# Patient Record
Sex: Male | Born: 1995 | Race: White | Hispanic: No | Marital: Single | Smoking: Never smoker
Health system: Southern US, Community
[De-identification: ages and names within clinical notes are randomized; demographics above are authoritative.]

## PROBLEM LIST (undated history)

## (undated) DIAGNOSIS — G4700 Insomnia, unspecified: Secondary | ICD-10-CM

## (undated) DIAGNOSIS — T7840XA Allergy, unspecified, initial encounter: Secondary | ICD-10-CM

## (undated) DIAGNOSIS — F909 Attention-deficit hyperactivity disorder, unspecified type: Secondary | ICD-10-CM

## (undated) DIAGNOSIS — Z00129 Encounter for routine child health examination without abnormal findings: Secondary | ICD-10-CM

## (undated) DIAGNOSIS — B079 Viral wart, unspecified: Secondary | ICD-10-CM

## (undated) DIAGNOSIS — M8580 Other specified disorders of bone density and structure, unspecified site: Secondary | ICD-10-CM

## (undated) HISTORY — DX: Attention-deficit hyperactivity disorder, unspecified type: F90.9

## (undated) HISTORY — PX: WISDOM TOOTH EXTRACTION: SHX21

## (undated) HISTORY — DX: Viral wart, unspecified: B07.9

## (undated) HISTORY — PX: FRACTURE SURGERY: SHX138

## (undated) HISTORY — DX: Encounter for routine child health examination without abnormal findings: Z00.129

## (undated) HISTORY — DX: Allergy, unspecified, initial encounter: T78.40XA

## (undated) HISTORY — DX: Other specified disorders of bone density and structure, unspecified site: M85.80

## (undated) HISTORY — DX: Insomnia, unspecified: G47.00

---
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
## 2014-06-21 ENCOUNTER — Ambulatory Visit (INDEPENDENT_AMBULATORY_CARE_PROVIDER_SITE_OTHER): Payer: BC Managed Care – PPO | Admitting: Family Medicine

## 2014-06-21 ENCOUNTER — Ambulatory Visit (HOSPITAL_BASED_OUTPATIENT_CLINIC_OR_DEPARTMENT_OTHER)
Admission: RE | Admit: 2014-06-21 | Discharge: 2014-06-21 | Disposition: A | Discharge status: Home / Self Care | Payer: BC Managed Care – PPO | Source: Ambulatory Visit | Attending: Family Medicine | Admitting: Family Medicine

## 2014-06-21 ENCOUNTER — Encounter: Payer: Self-pay | Admitting: Family Medicine

## 2014-06-21 VITALS — BP 130/68 | HR 85 | Temp 98.8°F | Ht 70.5 in | Wt 136.8 lb

## 2014-06-21 DIAGNOSIS — R079 Chest pain, unspecified: Secondary | ICD-10-CM

## 2014-06-21 NOTE — Patient Instructions (Signed)
Encouraged increased rest and hydration, add probiotics, zinc such as Coldeze or Xicam. Treat fevers as needed. Plain mucinex twice a day x 1 week, probiotics include Digestive Advantage, Phillip's COlon health or a generic  Try Salon Pas gel or patches for the discomfort  Costochondritis Costochondritis, sometimes called Tietze syndrome, is a swelling and irritation (inflammation) of the tissue (cartilage) that connects your ribs with your breastbone (sternum). It causes pain in the chest and rib area. Costochondritis usually goes away on its own over time. It can take up to 6 weeks or longer to get better, especially if you are unable to limit your activities. CAUSES  Some cases of costochondritis have no known cause. Possible causes include:  Injury (trauma).  Exercise or activity such as lifting.  Severe coughing. SIGNS AND SYMPTOMS  Pain and tenderness in the chest and rib area.  Pain that gets worse when coughing or taking deep breaths.  Pain that gets worse with specific movements. DIAGNOSIS  Your health care provider will do a physical exam and ask about your symptoms. Chest X-rays or other tests may be done to rule out other problems. TREATMENT  Costochondritis usually goes away on its own over time. Your health care provider may prescribe medicine to help relieve pain. HOME CARE INSTRUCTIONS   Avoid exhausting physical activity. Try not to strain your ribs during normal activity. This would include any activities using chest, abdominal, and side muscles, especially if heavy weights are used.  Apply ice to the affected area for the first 2 days after the pain begins.  Put ice in a plastic bag.  Place a towel between your skin and the bag.  Leave the ice on for 20 minutes, 2-3 times a day.  Only take over-the-counter or prescription medicines as directed by your health care provider. SEEK MEDICAL CARE IF:  You have redness or swelling at the rib joints. These are signs  of infection.  Your pain does not go away despite rest or medicine. SEEK IMMEDIATE MEDICAL CARE IF:   Your pain increases or you are very uncomfortable.  You have shortness of breath or difficulty breathing.  You cough up blood.  You have worse chest pains, sweating, or vomiting.  You have a fever or persistent symptoms for more than 2-3 days.  You have a fever and your symptoms suddenly get worse. MAKE SURE YOU:   Understand these instructions.  Will watch your condition.  Will get help right away if you are not doing well or get worse. Document Released: 07/15/2005 Document Revised: 07/26/2013 Document Reviewed: 05/09/2013 [facility] Patient Information 2015 [place], [place]. This information is not intended to replace advice given to you by your health care provider. Make sure you discuss any questions you have with your health care provider.

## 2014-06-25 ENCOUNTER — Encounter: Payer: Self-pay | Admitting: Family Medicine

## 2014-06-25 DIAGNOSIS — R079 Chest pain, unspecified: Secondary | ICD-10-CM | POA: Insufficient documentation

## 2014-06-25 NOTE — Progress Notes (Signed)
Patient ID: Brandon Pitts, male   DOB: Mar 10, 1996, 18 y.o.   MRN: [id number] Raef Sprigg [id number] 1996-09-05 06/25/2014      Progress Note-Follow Up  Subjective  Chief Complaint  Chief Complaint  Patient presents with  . ribs hurting    both [place] X have had pain for awhile last Couple of months can't lay on stomach or Hover  . warts    on both hands    HPI  Patient is a 18 year old male in today for routine medical care. In today complaining of pain on his lower anterior ribs bilaterally only when he lies on his stomach. No trouble he lies on his back or side. No recent trauma. No shortness of breath or congestion. No other acute complaints such as illness or fever. Denies palp/SOB/HA/congestion/fevers/GI or GU c/o. Taking meds as prescribed  Past Medical History  Diagnosis Date  . ADHD (attention deficit hyperactivity disorder)   . WCC (well child check) 06/20/2012  . Low bone density for age 05/20/2012  . Insomnia 08/03/2012    Past Surgical History  Procedure Laterality Date  . Wisdom tooth extraction    . Fracture surgery      pin in left elbow, removed    Family History  Problem Relation Age of Onset  . Hypothyroidism Mother   . Cancer Father     testicular, mid 58s  . Hyperlipidemia Father   . Hypertension Father   . ADD / ADHD Sister   . Cancer Maternal Grandfather     breast  . Hypertension Maternal Grandfather   . Hyperlipidemia Maternal Grandfather   . Seizures Paternal Grandmother   . Hypertension Paternal Grandmother   . Hypertension Paternal Grandfather     uncontrolled  . Cancer Paternal Grandfather     precancer of colon s/p partial colectomy    History   Social History  . Marital Status: Single    Spouse Name: N/A    Number of Children: N/A  . Years of Education: N/A   Occupational History  . Not on file.   Social History Main Topics  . Smoking status: Never Smoker   . Smokeless tobacco: Never Used  . Alcohol Use: No  . Drug Use: No  .  Sexual Activity: Yes    Partners: Female   Other Topics Concern  . Not on file   Social History Narrative  . No narrative on file    No current outpatient prescriptions on file prior to visit.   No current facility-administered medications on file prior to visit.    Allergies  Allergen Reactions  . Penicillins     rash    Review of Systems  Review of Systems  Constitutional: Negative for fever and malaise/fatigue.  HENT: Negative for congestion.   Eyes: Negative for discharge.  Respiratory: Negative for shortness of breath.   Cardiovascular: Positive for chest pain. Negative for palpitations and leg swelling.  Gastrointestinal: Negative for nausea, abdominal pain and diarrhea.  Genitourinary: Negative for dysuria.  Musculoskeletal: Negative for falls.  Skin: Negative for rash.  Neurological: Negative for loss of consciousness and headaches.  Endo/Heme/Allergies: Negative for polydipsia.  Psychiatric/Behavioral: Negative for depression and suicidal ideas. The patient is not nervous/anxious and does not have insomnia.     Objective  BP 130/68  Pulse 85  Temp(Src) 98.8 F (37.1 C) (Oral)  Ht 5' 10.5" (1.791 m)  Wt 136 lb 12.8 oz (62.052 kg)  BMI 19.34 kg/m2  SpO2 98%  Physical Exam  Physical Exam  Constitutional: He is oriented to person, place, and time and well-developed, well-nourished, and in no distress. No distress.  HENT:  Head: Normocephalic and atraumatic.  Eyes: Conjunctivae are normal.  Neck: Neck supple. No thyromegaly present.  Cardiovascular: Normal rate, regular rhythm and normal heart sounds.   No murmur heard. Pulmonary/Chest: Effort normal and breath sounds normal. No respiratory distress.  Abdominal: He exhibits no distension and no mass. There is no tenderness.  Musculoskeletal: He exhibits no edema.  Neurological: He is alert and oriented to person, place, and time.  Skin: Skin is warm.  Psychiatric: Memory, affect and judgment normal.       Assessment & Plan  Pain in the chest Only anterior lower ribs b/l when he lies on them. Likely related to body habitus and growth spurt, may try topical treatments and report worsening symptolms. CXR is unremarkable today

## 2014-06-25 NOTE — Assessment & Plan Note (Signed)
Only anterior lower ribs b/l when he lies on them. Likely related to body habitus and growth spurt, may try topical treatments and report worsening symptolms. CXR is unremarkable today

## 2014-07-03 ENCOUNTER — Ambulatory Visit (INDEPENDENT_AMBULATORY_CARE_PROVIDER_SITE_OTHER): Payer: BC Managed Care – PPO | Admitting: Physician Assistant

## 2014-07-03 ENCOUNTER — Encounter: Payer: Self-pay | Admitting: Physician Assistant

## 2014-07-03 VITALS — BP 114/76 | HR 80 | Temp 98.4°F | Resp 16 | Ht 69.5 in | Wt 138.2 lb

## 2014-07-03 DIAGNOSIS — J209 Acute bronchitis, unspecified: Secondary | ICD-10-CM

## 2014-07-03 MED ORDER — AZITHROMYCIN 250 MG PO TABS
ORAL_TABLET | ORAL | Status: DC
Start: 2014-07-03 — End: 2016-03-17

## 2014-07-03 NOTE — Patient Instructions (Signed)
Please take Azithromycin as directed.  Increase fluid intake.  Rest.  Use Delsym for cough.  Call or return to clinic if symptoms are not improving.

## 2014-07-03 NOTE — Progress Notes (Signed)
Patient presents to clinic today c/o chest congestion, productive cough and fatigue x 2.5 weeks.  Also endorses mild sore throat that is improving.  Denies SOB, pleuritic chest pain or wheezing.  Past Medical History  Diagnosis Date  . ADHD (attention deficit hyperactivity disorder)   . WCC (well child check) 06/20/2012  . Low bone density for age 19/11/2011  . Insomnia 08/03/2012    No current outpatient prescriptions on file prior to visit.   No current facility-administered medications on file prior to visit.    Allergies  Allergen Reactions  . Penicillins     rash    Family History  Problem Relation Age of Onset  . Hypothyroidism Mother   . Cancer Father     testicular, mid 16s  . Hyperlipidemia Father   . Hypertension Father   . ADD / ADHD Sister   . Cancer Maternal Grandfather     breast  . Hypertension Maternal Grandfather   . Hyperlipidemia Maternal Grandfather   . Seizures Paternal Grandmother   . Hypertension Paternal Grandmother   . Hypertension Paternal Grandfather     uncontrolled  . Cancer Paternal Grandfather     precancer of colon s/p partial colectomy    History   Social History  . Marital Status: Single    Spouse Name: N/A    Number of Children: N/A  . Years of Education: N/A   Social History Main Topics  . Smoking status: Never Smoker   . Smokeless tobacco: Never Used  . Alcohol Use: No  . Drug Use: No  . Sexual Activity: Yes    Partners: Female   Other Topics Concern  . None   Social History Narrative  . None   Review of Systems - See HPI.  All other ROS are negative.  BP 114/76  Pulse 80  Temp(Src) 98.4 F (36.9 C) (Oral)  Resp 16  Ht 5' 9.5" (1.765 m)  Wt 138 lb 4 oz (62.71 kg)  BMI 20.13 kg/m2  SpO2 97%  Physical Exam  Vitals reviewed. Constitutional: He is oriented to person, place, and time and well-developed, well-nourished, and in no distress.  HENT:  Head: Normocephalic and atraumatic.  Right Ear: External ear  normal.  Left Ear: External ear normal.  Nose: Nose normal.  Mouth/Throat: Oropharynx is clear and moist. No oropharyngeal exudate.  TM within normal limits bilaterally.  Eyes: Conjunctivae are normal. Pupils are equal, round, and reactive to light.  Neck: Neck supple.  Cardiovascular: Normal rate, regular rhythm, normal heart sounds and intact distal pulses.   Pulmonary/Chest: Effort normal and breath sounds normal. No respiratory distress. He has no wheezes. He has no rales. He exhibits no tenderness.  Neurological: He is alert and oriented to person, place, and time.  Skin: Skin is warm and dry. No rash noted.  Psychiatric: Affect normal.   Assessment/Plan: Acute bronchitis Rx Azithromycin.  Increase fluids.  Rest.  Saline nasal spray. Probiotic. Delsym for cough.  Return precautions discussed with patient.

## 2014-07-03 NOTE — Progress Notes (Signed)
Pre visit review using our clinic review tool, if applicable. No additional management support is needed unless otherwise documented below in the visit note/SLS  

## 2014-07-03 NOTE — Assessment & Plan Note (Signed)
Rx Azithromycin.  Increase fluids.  Rest.  Saline nasal spray. Probiotic. Delsym for cough.  Return precautions discussed with patient.

## 2015-01-07 ENCOUNTER — Encounter: Payer: BLUE CROSS/BLUE SHIELD | Admitting: Family Medicine

## 2015-01-16 ENCOUNTER — Telehealth: Payer: Self-pay | Admitting: Family Medicine

## 2015-01-16 ENCOUNTER — Encounter: Payer: Self-pay | Admitting: Family Medicine

## 2015-01-16 NOTE — Telephone Encounter (Signed)
Pt was no show for CPE on 01/07/15- letter sent- would you like us to attempt to reschedule? Charge?

## 2015-01-16 NOTE — Telephone Encounter (Signed)
He has not been in for a check up in awhile. We should try and reschedule. He is a [employer] student so am willing to forgo the fee.

## 2015-07-29 ENCOUNTER — Ambulatory Visit (INDEPENDENT_AMBULATORY_CARE_PROVIDER_SITE_OTHER): Payer: BLUE CROSS/BLUE SHIELD | Admitting: Physician Assistant

## 2015-07-29 VITALS — BP 130/60 | HR 87 | Temp 98.9°F | Resp 16 | Ht 70.0 in | Wt 142.4 lb

## 2015-07-29 DIAGNOSIS — J069 Acute upper respiratory infection, unspecified: Secondary | ICD-10-CM

## 2015-07-29 DIAGNOSIS — B9789 Other viral agents as the cause of diseases classified elsewhere: Principal | ICD-10-CM

## 2015-07-29 MED ORDER — HYDROCODONE-HOMATROPINE 5-1.5 MG/5ML PO SYRP: 5.0000 mL | ORAL_SOLUTION | Freq: Three times a day (TID) | ORAL | Status: DC | PRN

## 2015-07-29 MED ORDER — LORATADINE-PSEUDOEPHEDRINE ER 10-240 MG PO TB24: 1.0000 | ORAL_TABLET | Freq: Every day | ORAL | Status: DC

## 2015-07-29 MED ORDER — NAPROXEN 500 MG PO TABS: 500.0000 mg | ORAL_TABLET | Freq: Two times a day (BID) | ORAL | Status: DC

## 2015-07-29 NOTE — Progress Notes (Signed)
07/29/2015 at 9:05 PM  Brandon Pitts / DOB: June 02, 1996 / MRN: [id number]  The patient has Goiter, unspecified; Unspecified vitamin D deficiency; ADHD (attention deficit hyperactivity disorder); WCC (well child check); Low bone density for age; Insomnia; Pain in the chest; and Acute bronchitis on his problem list.  SUBJECTIVE  Brandon Pitts is a 19 y.o. male complaining of sinus and nasal congestion and sore throat that started 1 days ago.  Associated symptoms include sore throat and headache today, and he denies fever, difficulty breathing and jaw pain.The patient symptoms are worsening. Treatments tried thus far include nothing. He reports sick contacts.    He  has a past medical history of ADHD (attention deficit hyperactivity disorder); WCC (well child check) (06/20/2012); Low bone density for age (06/20/2012); Insomnia (08/03/2012); and Allergy.    Medications reviewed and updated by myself where necessary, and exist elsewhere in the encounter.   Brandon Pitts is allergic to penicillins. He  reports that he has never smoked. He has never used smokeless tobacco. He reports that he does not drink alcohol or use illicit drugs. He  reports that he currently engages in sexual activity and has had male partners. The patient  has past surgical history that includes Wisdom tooth extraction and Fracture surgery.  His family history includes ADD / ADHD in his sister; Cancer in his father, maternal grandfather, and paternal grandfather; Hyperlipidemia in his father and maternal grandfather; Hypertension in his father, maternal grandfather, paternal grandfather, and paternal grandmother; Hypothyroidism in his mother; Seizures in his paternal grandmother.  Review of Systems  Constitutional: Negative for fever and chills.  Respiratory: Negative for shortness of breath.   Cardiovascular: Negative for chest pain.  Gastrointestinal: Negative for nausea and abdominal pain.  Genitourinary: Negative.   Skin:  Negative for rash.  Neurological: Negative for dizziness and headaches.    OBJECTIVE  His  height is  (1.778 m) and weight is 142 lb 6 oz (64.581 kg). His temperature is 98.9 F (37.2 C). His blood pressure is 130/60 and his pulse is 87. His respiration is 16 and oxygen saturation is 98%.  The patient's body mass index is 20.43 kg/(m^2).  Physical Exam  Constitutional: He is oriented to person, place, and time. He appears well-developed and well-nourished. No distress.  HENT:  Right Ear: Hearing, tympanic membrane, external ear and ear canal normal.  Left Ear: Hearing, tympanic membrane, external ear and ear canal normal.  Nose: Mucosal edema present. No sinus tenderness. Right sinus exhibits no maxillary sinus tenderness and no frontal sinus tenderness. Left sinus exhibits no maxillary sinus tenderness and no frontal sinus tenderness.  Mouth/Throat: Uvula is midline and mucous membranes are normal. Mucous membranes are not pale, not dry and not cyanotic. Posterior oropharyngeal erythema present. No oropharyngeal exudate, posterior oropharyngeal edema or tonsillar abscesses.  Cardiovascular: Normal rate and regular rhythm.   Respiratory: Effort normal and breath sounds normal. He has no wheezes. He has no rales.  Musculoskeletal: Normal range of motion.  Lymphadenopathy:    He has no cervical adenopathy.  Neurological: He is alert and oriented to person, place, and time.  Skin: Skin is warm and dry. He is not diaphoretic.  Psychiatric: He has a normal mood and affect.    No results found for this or any previous visit (from the past 24 hour(s)).  ASSESSMENT & PLAN  Loc was seen today for generalized body aches, headache, cough and sore throat.  Diagnoses and all orders for this visit:  Viral  URI with cough: Exam reassuring.  Will treat symptomatically for now.  Advised he RTC or call if his symptoms change.  -     naproxen (NAPROSYN) 500 MG tablet; Take 1 tablet (500 mg  total) by mouth 2 (two) times daily with a meal. -     HYDROcodone-homatropine (HYCODAN) 5-1.5 MG/5ML syrup; Take 5 mLs by mouth every 8 (eight) hours as needed for cough. -     loratadine-pseudoephedrine (CLARITIN-D 24 HOUR) 10-240 MG 24 hr tablet; Take 1 tablet by mouth daily.    The patient was advised to call or come back to clinic if he does not see an improvement in symptoms, or worsens with the above plan.   Deliah Boston, MHS, PA-C [facility] 07/29/2015 9:05 PM

## 2016-03-17 ENCOUNTER — Other Ambulatory Visit: Payer: Self-pay | Admitting: Family Medicine

## 2016-03-17 ENCOUNTER — Ambulatory Visit (INDEPENDENT_AMBULATORY_CARE_PROVIDER_SITE_OTHER): Payer: BLUE CROSS/BLUE SHIELD | Admitting: Family Medicine

## 2016-03-17 ENCOUNTER — Encounter: Payer: Self-pay | Admitting: Family Medicine

## 2016-03-17 VITALS — BP 116/60 | HR 96 | Temp 99.7°F | Ht 70.0 in | Wt 146.0 lb

## 2016-03-17 DIAGNOSIS — W57XXXA Bitten or stung by nonvenomous insect and other nonvenomous arthropods, initial encounter: Secondary | ICD-10-CM

## 2016-03-17 DIAGNOSIS — T148 Other injury of unspecified body region: Secondary | ICD-10-CM

## 2016-03-17 DIAGNOSIS — Z8719 Personal history of other diseases of the digestive system: Secondary | ICD-10-CM

## 2016-03-17 MED ORDER — NYSTATIN 100000 UNIT/ML MT SUSP: 5.0000 mL | Freq: Four times a day (QID) | OROMUCOSAL | Status: DC

## 2016-03-17 MED ORDER — DOXYCYCLINE HYCLATE 100 MG PO TABS
100.0000 mg | ORAL_TABLET | Freq: Two times a day (BID) | ORAL | Status: DC
Start: 2016-03-17 — End: 2016-03-23

## 2016-03-17 NOTE — Patient Instructions (Signed)
Tick Bite Information Ticks are insects that attach themselves to the skin and draw blood for food. There are various types of ticks. Common types include wood ticks and deer ticks. Most ticks live in shrubs and grassy areas. Ticks can climb onto your body when you make contact with leaves or grass where the tick is waiting. The most common places on the body for ticks to attach themselves are the scalp, neck, armpits, waist, and groin. Most tick bites are harmless, but sometimes ticks carry germs that cause diseases. These germs can be spread to a person during the tick's feeding process. The chance of a disease spreading through a tick bite depends on:   The type of tick.  Time of year.   How long the tick is attached.   Geographic location.  HOW CAN YOU PREVENT TICK BITES? Take these steps to help prevent tick bites when you are outdoors:  Wear protective clothing. Long sleeves and long pants are best.   Wear white clothes so you can see ticks more easily.  Tuck your pant legs into your socks.   If walking on a trail, stay in the middle of the trail to avoid brushing against bushes.  Avoid walking through areas with long grass.  Put insect repellent on all exposed skin and along boot tops, pant legs, and sleeve cuffs.   Check clothing, hair, and skin repeatedly and before going inside.   Brush off any ticks that are not attached.  Take a shower or bath as soon as possible after being outdoors.  WHAT IS THE PROPER WAY TO REMOVE A TICK? Ticks should be removed as soon as possible to help prevent diseases caused by tick bites. 1. If latex gloves are available, put them on before trying to remove a tick.  2. Using fine-point tweezers, grasp the tick as close to the skin as possible. You may also use curved forceps or a tick removal tool. Grasp the tick as close to its head as possible. Avoid grasping the tick on its body. 3. Pull gently with steady upward pressure until  the tick lets go. Do not twist the tick or jerk it suddenly. This may break off the tick's head or mouth parts. 4. Do not squeeze or crush the tick's body. This could force disease-carrying fluids from the tick into your body.  5. After the tick is removed, wash the bite area and your hands with soap and water or other disinfectant such as alcohol. 6. Apply a small amount of antiseptic cream or ointment to the bite site.  7. Wash and disinfect any instruments that were used.  Do not try to remove a tick by applying a hot match, petroleum jelly, or fingernail polish to the tick. These methods do not work and may increase the chances of disease being spread from the tick bite.  WHEN SHOULD YOU SEEK MEDICAL CARE? Contact your health care provider if you are unable to remove a tick from your skin or if a part of the tick breaks off and is stuck in the skin.  After a tick bite, you need to be aware of signs and symptoms that could be related to diseases spread by ticks. Contact your health care provider if you develop any of the following in the days or weeks after the tick bite:  Unexplained fever.  Rash. A circular rash that appears days or weeks after the tick bite may indicate the possibility of Lyme disease. The rash may resemble   a target with a bull's-eye and may occur at a different part of your body than the tick bite.  Redness and swelling in the area of the tick bite.   Tender, swollen lymph glands.   Diarrhea.   Weight loss.   Cough.   Fatigue.   Muscle, joint, or bone pain.   Abdominal pain.   Headache.   Lethargy or a change in your level of consciousness.  Difficulty walking or moving your legs.   Numbness in the legs.   Paralysis.  Shortness of breath.   Confusion.   Repeated vomiting.    This information is not intended to replace advice given to you by your health care provider. Make sure you discuss any questions you have with your health  care provider.   Document Released: 10/02/2000 Document Revised: 10/26/2014 Document Reviewed: 03/15/2013 Elsevier Interactive Patient Education 2016 Elsevier Inc.  

## 2016-03-17 NOTE — Progress Notes (Signed)
Pre visit review using our clinic review tool, if applicable. No additional management support is needed unless otherwise documented below in the visit note. 

## 2016-03-17 NOTE — Progress Notes (Signed)
Patient ID: Brandon Pitts, male    DOB: 11/08/1995  Age: 20 y.o. MRN: [id number]    Subjective:  Subjective HPI Brandon Pitts presents for fever 101, bodyaches x 2 days.  He has found mult ticks on him over last 3-4 weeks.   No congestion,  No cough.    Review of Systems  Constitutional: Positive for fever, chills and fatigue. Negative for diaphoresis, appetite change and unexpected weight change.  HENT: Negative for congestion, ear pain, hearing loss, nosebleeds, postnasal drip, rhinorrhea, sinus pressure, sneezing and tinnitus.   Eyes: Negative for photophobia, pain, discharge, redness, itching and visual disturbance.  Respiratory: Negative.  Negative for cough, chest tightness, shortness of breath and wheezing.   Cardiovascular: Negative.  Negative for chest pain, palpitations and leg swelling.  Gastrointestinal: Negative for abdominal pain, constipation, blood in stool, abdominal distention and anal bleeding.  Endocrine: Negative.  Negative for cold intolerance, heat intolerance, polydipsia, polyphagia and polyuria.  Genitourinary: Negative.  Negative for dysuria, frequency and difficulty urinating.  Musculoskeletal: Negative.   Skin: Negative.   Allergic/Immunologic: Negative.   Neurological: Negative for dizziness, weakness, light-headedness, numbness and headaches.  Psychiatric/Behavioral: Negative for suicidal ideas, confusion, sleep disturbance, dysphoric mood, decreased concentration and agitation. The patient is not nervous/anxious.     History Past Medical History  Diagnosis Date  . ADHD (attention deficit hyperactivity disorder)   . WCC (well child check) 06/20/2012  . Low bone density for age 72/11/2011  . Insomnia 08/03/2012  . Allergy     He has past surgical history that includes Wisdom tooth extraction and Fracture surgery.   His family history includes ADD / ADHD in his sister; Cancer in his father, maternal grandfather, and paternal grandfather; Hyperlipidemia in his  father and maternal grandfather; Hypertension in his father, maternal grandfather, paternal grandfather, and paternal grandmother; Hypothyroidism in his mother; Seizures in his paternal grandmother.He reports that he has never smoked. He has never used smokeless tobacco. He reports that he does not drink alcohol or use illicit drugs.  No current outpatient prescriptions on file prior to visit.   No current facility-administered medications on file prior to visit.     Objective:  Objective Physical Exam  Constitutional: He is oriented to person, place, and time. Vital signs are normal. He appears well-developed and well-nourished. He is sleeping. He has a sickly appearance.  HENT:  Head: Normocephalic and atraumatic.  Mouth/Throat: Oropharynx is clear and moist.  Eyes: EOM are normal. Pupils are equal, round, and reactive to light.  Neck: Normal range of motion and full passive range of motion without pain. Neck supple. No thyromegaly present.  Cardiovascular: Normal rate and regular rhythm.   No murmur heard. Pulmonary/Chest: Effort normal and breath sounds normal. No respiratory distress. He has no wheezes. He has no rales. He exhibits no tenderness.  Musculoskeletal: He exhibits no edema or tenderness.  Neurological: He is alert and oriented to person, place, and time.  Skin: Skin is warm and dry. No rash noted. No erythema.  No obvious bites today Pt is flushed--- warm to touch  Psychiatric: He has a normal mood and affect. His behavior is normal. Judgment and thought content normal.  Nursing note and vitals reviewed.  BP 116/60 mmHg  Pulse 96  Temp(Src) 99.7 F (37.6 C) (Oral)  Ht 5\' 10"  (1.778 m)  Wt 146 lb (66.225 kg)  BMI 20.95 kg/m2  SpO2 99% Wt Readings from Last 3 Encounters:  03/17/16 146 lb (66.225 kg)  07/29/15 142 lb 6  oz (64.581 kg) (29 %*, Z = -0.56)  07/03/14 138 lb 4 oz (62.71 kg) (27 %*, Z = -0.60)   * Growth percentiles are based on CDC 2-20 Years data.      No results found for: WBC, HGB, HCT, PLT, GLUCOSE, CHOL, TRIG, HDL, LDLDIRECT, LDLCALC, ALT, AST, NA, K, CL, CREATININE, BUN, CO2, TSH, PSA, INR, GLUF, HGBA1C, MICROALBUR  Dg Chest 2 View  06/21/2014  CLINICAL DATA:  Chest pain EXAM: CHEST  2 VIEW COMPARISON:  None. FINDINGS: The heart size and mediastinal contours are within normal limits. Both lungs are clear. The visualized skeletal structures are unremarkable. IMPRESSION: No active cardiopulmonary disease. Electronically Signed   By: Natasha Mead M.D.   On: 06/21/2014 16:20     Assessment & Plan:  Plan I have discontinued [name] azithromycin, naproxen, HYDROcodone-homatropine, and loratadine-pseudoephedrine. I am also having him start on doxycycline and nystatin.  Meds ordered this encounter  Medications  . doxycycline (VIBRA-TABS) 100 MG tablet    Sig: Take 1 tablet (100 mg total) by mouth 2 (two) times daily.    Dispense:  20 tablet    Refill:  0  . nystatin (MYCOSTATIN) 100000 UNIT/ML suspension    Sig: Take 5 mLs (500,000 Units total) by mouth 4 (four) times daily.    Dispense:  60 mL    Refill:  0    Problem List Items Addressed This Visit    None    Visit Diagnoses    Tick bite of multiple sites    -  Primary    Relevant Medications    doxycycline (VIBRA-TABS) 100 MG tablet    Other Relevant Orders    CBC with Differential/Platelet    B. burgdorfi antibodies    Rocky mtn spotted fvr ab, IgM-blood    H/O oral aphthous ulcers        Relevant Medications    nystatin (MYCOSTATIN) 100000 UNIT/ML suspension       Follow-up: Return if symptoms worsen or fail to improve.  Donato Schultz, DO

## 2016-03-18 ENCOUNTER — Telehealth: Payer: Self-pay | Admitting: *Deleted

## 2016-03-18 DIAGNOSIS — R899 Unspecified abnormal finding in specimens from other organs, systems and tissues: Secondary | ICD-10-CM

## 2016-03-18 DIAGNOSIS — D729 Disorder of white blood cells, unspecified: Secondary | ICD-10-CM

## 2016-03-18 LAB — CBC WITH DIFFERENTIAL/PLATELET
BASOS ABS: 0 10*3/uL (ref 0.0–0.1)
Basophils Relative: 0.4 % (ref 0.0–3.0)
EOS ABS: 0 10*3/uL (ref 0.0–0.7)
Eosinophils Relative: 0.1 % (ref 0.0–5.0)
HEMATOCRIT: 44.8 % (ref 39.0–52.0)
HEMOGLOBIN: 15.2 g/dL (ref 13.0–17.0)
LYMPHS PCT: 8.7 % — AB (ref 12.0–46.0)
Lymphs Abs: 0.3 10*3/uL — ABNORMAL LOW (ref 0.7–4.0)
MCHC: 33.8 g/dL (ref 30.0–36.0)
MCV: 91.4 fl (ref 78.0–100.0)
Monocytes Absolute: 0.4 10*3/uL (ref 0.1–1.0)
Monocytes Relative: 10.8 % (ref 3.0–12.0)
Neutro Abs: 2.6 10*3/uL (ref 1.4–7.7)
Neutrophils Relative %: 80 % — ABNORMAL HIGH (ref 43.0–77.0)
Platelets: 124 10*3/uL — ABNORMAL LOW (ref 150.0–400.0)
RBC: 4.9 Mil/uL (ref 4.22–5.81)
RDW: 13.3 % (ref 11.5–14.6)
WBC: 3.3 10*3/uL — AB (ref 4.5–10.5)

## 2016-03-18 NOTE — Telephone Encounter (Signed)
Spoke with the pt and informed him of recent lab results and note.  Pt verbalized understanding and agreed.  Pt stated that he is feeling a lot better today.  Pt will call back to schedule lab appt.  Future lab ordered and sent.//AB/CMA

## 2016-03-18 NOTE — Telephone Encounter (Signed)
-----   Message from Donato SchultzYvonne R Lowne Chase, [place]DO sent at 03/18/2016  4:05 PM EDT ----- How is pt feeling?  Wbc and diff abn------  Repeat after abx

## 2016-03-19 LAB — LYME AB/WESTERN BLOT REFLEX: B burgdorferi Ab IgG+IgM: <0.9 Index (ref <0.90)

## 2016-03-19 LAB — ROCKY MTN SPOTTED FVR ABS PNL(IGG+IGM)
RMSF IGG: NOT DETECTED
RMSF IGM: NOT DETECTED

## 2016-03-20 ENCOUNTER — Encounter: Payer: Self-pay | Admitting: Family Medicine

## 2016-03-21 ENCOUNTER — Encounter: Payer: Self-pay | Admitting: Family Medicine

## 2016-03-22 ENCOUNTER — Encounter: Payer: Self-pay | Admitting: Family Medicine

## 2016-03-23 MED ORDER — CLARITHROMYCIN ER 500 MG PO TB24: 1000.0000 mg | ORAL_TABLET | Freq: Every day | ORAL | Status: DC

## 2016-03-23 NOTE — Telephone Encounter (Signed)
-----   Message from Donato SchultzYvonne R Lowne Chase, DO sent at 03/23/2016  8:32 AM EDT ----- See my chart message--- doxy and pen are not related.   What reaction is he having? Can change to biaxin xl 2 po qd x 7 days

## 2016-03-23 NOTE — Telephone Encounter (Signed)
Ok-- document allergy and send biaxin-- thanks

## 2016-03-23 NOTE — Telephone Encounter (Signed)
I have done that, the patient is still complaining of the HA, stiffness in the neck and fatigue.    KP

## 2016-03-23 NOTE — Telephone Encounter (Signed)
The Rx has been sent.    KP

## 2016-05-11 ENCOUNTER — Ambulatory Visit: Payer: BLUE CROSS/BLUE SHIELD | Admitting: Family Medicine

## 2016-05-11 ENCOUNTER — Encounter: Payer: Self-pay | Admitting: Family Medicine

## 2016-05-11 ENCOUNTER — Ambulatory Visit (INDEPENDENT_AMBULATORY_CARE_PROVIDER_SITE_OTHER): Payer: BLUE CROSS/BLUE SHIELD | Admitting: Family Medicine

## 2016-05-11 VITALS — BP 116/62 | HR 64 | Temp 98.2°F | Ht 71.0 in | Wt 140.4 lb

## 2016-05-11 DIAGNOSIS — T148 Other injury of unspecified body region: Secondary | ICD-10-CM

## 2016-05-11 DIAGNOSIS — W57XXXA Bitten or stung by nonvenomous insect and other nonvenomous arthropods, initial encounter: Secondary | ICD-10-CM

## 2016-05-11 MED ORDER — CLARITHROMYCIN ER 500 MG PO TB24: 1000.0000 mg | ORAL_TABLET | Freq: Every day | ORAL | 0 refills | Status: DC

## 2016-05-11 MED ORDER — TRIAMCINOLONE ACETONIDE 0.1 % EX CREA
1.0000 | TOPICAL_CREAM | Freq: Two times a day (BID) | CUTANEOUS | 0 refills | Status: DC
Start: 2016-05-11 — End: 2017-07-01

## 2016-05-11 NOTE — Patient Instructions (Signed)
We will use the biaxin to help prevent lyme disease.  Watch out for any symptoms of fever, chills, flu like feeling or rash- if these occur let me know! Try the triamcinolone as needed for the itching on your skin- apply to affected area (not face) up to twice a day as needed

## 2016-05-11 NOTE — Progress Notes (Signed)
Pre visit review using our clinic review tool, if applicable. No additional management support is needed unless otherwise documented below in the visit note. 

## 2016-05-11 NOTE — Progress Notes (Signed)
[facility] [address] [place], [place] [address] [phone]  Date:  05/11/2016   Name:  Brandon Pitts   DOB:  1996/03/21   MRN:  [id number]  PCP:  Danise Edge, MD    Chief Complaint: No chief complaint on file.   History of Present Illness:  Brandon Pitts is a 20 y.o. very pleasant male patient who presents with the following:  Here today to discuss recent tick bites.  He is generally in good health.  He was outside yesterday checking on some deer cameras- he was not out long, but noted approx 88 "seed ticks" (tick larve) on his skin when he got back in.  They were mostly around his ankles. He used peppermint oil and q tips to remove them  He did get a rash with doxycycline the last time he was treated for tick bites, and is also allergic to amox.  When he had the rash with doxy he was changed to biaxin and tolerated this well He is otherwise generally feeling well- no fever, chills, body aches, unusual rashes, nausea, vomiting, belly pain or cough.   Patient Active Problem List   Diagnosis Date Noted  . Acute bronchitis 07/03/2014  . Pain in the chest 06/25/2014  . Insomnia 08/03/2012  . ADHD (attention deficit hyperactivity disorder) 06/20/2012  . WCC (well child check) 06/20/2012  . Low bone density for age 32/11/2011  . Goiter, unspecified 03/30/2011  . Unspecified vitamin D deficiency 03/30/2011    Past Medical History:  Diagnosis Date  . ADHD (attention deficit hyperactivity disorder)   . Allergy   . Insomnia 08/03/2012  . Low bone density for age 32/11/2011  . WCC (well child check) 06/20/2012    Past Surgical History:  Procedure Laterality Date  . FRACTURE SURGERY     pin in left elbow, removed  . WISDOM TOOTH EXTRACTION      Social History  Substance Use Topics  . Smoking status: Never Smoker  . Smokeless tobacco: Never Used  . Alcohol use No    Family History  Problem Relation Age of  Onset  . Hypothyroidism Mother   . Cancer Father     testicular, mid 36s  . Hyperlipidemia Father   . Hypertension Father   . ADD / ADHD Sister   . Cancer Maternal Grandfather     breast  . Hypertension Maternal Grandfather   . Hyperlipidemia Maternal Grandfather   . Seizures Paternal Grandmother   . Hypertension Paternal Grandmother   . Hypertension Paternal Grandfather     uncontrolled  . Cancer Paternal Grandfather     precancer of colon s/p partial colectomy    Allergies  Allergen Reactions  . Penicillins Rash    rash  . Doxycycline Rash    Medication list has been reviewed and updated.  Current Outpatient Prescriptions on File Prior to Visit  Medication Sig Dispense Refill  . clarithromycin (BIAXIN XL) 500 MG 24 hr tablet Take 2 tablets (1,000 mg total) by mouth daily. 14 tablet 0  . nystatin (MYCOSTATIN) 100000 UNIT/ML suspension Take 5 mLs (500,000 Units total) by mouth 4 (four) times daily. 60 mL 0   No current facility-administered medications on file prior to visit.     Review of Systems:  As per HPI- otherwise negative.   Physical Examination: Vitals:   05/11/16 1639  BP: 116/62  Pulse: 64  Temp: 98.2 F (36.8 C)  Ideal Body Weight:    GEN: WDWN, NAD, Non-toxic, A & O x 3, normal weight, looks well HEENT: Atraumatic, Normocephalic. Neck supple. No masses, No LAD.  Bilateral TM wnl, oropharynx normal.  PEERL,EOMI.   Ears and Nose: No external deformity. CV: RRR, No M/G/R. No JVD. No thrill. No extra heart sounds. PULM: CTA B, no wheezes, crackles, rhonchi. No retractions. No resp. distress. No accessory muscle use. EXTR: No c/c/e NEURO Normal gait.  PSYCH: Normally interactive. Conversant. Not depressed or anxious appearing.  Calm demeanor.  He has mild irritation around his ankles consistent with multiple small insect bites   Assessment and Plan: Tick bite - Plan: triamcinolone cream (KENALOG) 0.1 %, clarithromycin (BIAXIN XL) 500 MG 24  hr tablet  Here today with recent multiple tick bites. He is concerned about illness. Given his volume of tick bites I do think prophylaxis is reasonable.  Will treat with biaxin xl- gave enough for a week but counseled that he can stop after 3-4 days if feeling well Also triamcinolone to use as needed for itching  Meds ordered this encounter  Medications  . triamcinolone cream (KENALOG) 0.1 %    Sig: Apply 1 application topically 2 (two) times daily.    Dispense:  30 g    Refill:  0  . clarithromycin (BIAXIN XL) 500 MG 24 hr tablet    Sig: Take 2 tablets (1,000 mg total) by mouth daily.    Dispense:  14 tablet    Refill:  0     Signed Abbe Amsterdam, MD

## 2016-10-20 DIAGNOSIS — Z23 Encounter for immunization: Secondary | ICD-10-CM | POA: Diagnosis not present

## 2017-02-24 DIAGNOSIS — H6691 Otitis media, unspecified, right ear: Secondary | ICD-10-CM | POA: Diagnosis not present

## 2017-02-24 DIAGNOSIS — J029 Acute pharyngitis, unspecified: Secondary | ICD-10-CM | POA: Diagnosis not present

## 2017-06-29 ENCOUNTER — Ambulatory Visit: Payer: BLUE CROSS/BLUE SHIELD | Admitting: Medical

## 2017-07-01 ENCOUNTER — Encounter: Payer: Self-pay | Admitting: Family Medicine

## 2017-07-01 ENCOUNTER — Ambulatory Visit (INDEPENDENT_AMBULATORY_CARE_PROVIDER_SITE_OTHER): Payer: BLUE CROSS/BLUE SHIELD | Admitting: Family Medicine

## 2017-07-01 DIAGNOSIS — M8580 Other specified disorders of bone density and structure, unspecified site: Secondary | ICD-10-CM | POA: Diagnosis not present

## 2017-07-01 DIAGNOSIS — B079 Viral wart, unspecified: Secondary | ICD-10-CM | POA: Diagnosis not present

## 2017-07-01 DIAGNOSIS — E049 Nontoxic goiter, unspecified: Secondary | ICD-10-CM | POA: Diagnosis not present

## 2017-07-01 DIAGNOSIS — Z8719 Personal history of other diseases of the digestive system: Secondary | ICD-10-CM

## 2017-07-01 DIAGNOSIS — E559 Vitamin D deficiency, unspecified: Secondary | ICD-10-CM

## 2017-07-01 DIAGNOSIS — M859 Disorder of bone density and structure, unspecified: Secondary | ICD-10-CM

## 2017-07-01 HISTORY — DX: Viral wart, unspecified: B07.9

## 2017-07-01 LAB — COMPREHENSIVE METABOLIC PANEL
ALBUMIN: 4.7 g/dL (ref 3.5–5.2)
ALK PHOS: 81 U/L (ref 39–117)
ALT: 12 U/L (ref 0–53)
AST: 14 U/L (ref 0–37)
BUN: 17 mg/dL (ref 6–23)
CO2: 32 mEq/L (ref 19–32)
Calcium: 10.1 mg/dL (ref 8.4–10.5)
Chloride: 104 mEq/L (ref 96–112)
Creatinine, Ser: 1.08 mg/dL (ref 0.40–1.50)
GFR: 91.06 mL/min (ref >60.00)
GLUCOSE: 66 mg/dL — AB (ref 70–99)
Potassium: 4.1 mEq/L (ref 3.5–5.1)
SODIUM: 142 meq/L (ref 135–145)
TOTAL PROTEIN: 6.6 g/dL (ref 6.0–8.3)
Total Bilirubin: 1.2 mg/dL (ref 0.2–1.2)

## 2017-07-01 LAB — TSH: TSH: 0.96 u[IU]/mL (ref 0.35–4.50)

## 2017-07-01 LAB — VITAMIN D 25 HYDROXY (VIT D DEFICIENCY, FRACTURES): VITD: 23.14 ng/mL — AB (ref 30.00–100.00)

## 2017-07-01 MED ORDER — NYSTATIN 100000 UNIT/ML MT SUSP: 5.0000 mL | Freq: Four times a day (QID) | OROMUCOSAL | 2 refills | Status: DC

## 2017-07-01 NOTE — Assessment & Plan Note (Addendum)
Labs reveal deficiency. Start on Vitamin D 50000 IU caps, 1 cap po weekly x 12 weeks. Disp #4 with 4 rf. Also take daily Vitamin D over the counter. If already taking a daily supplement increase by 1000 IU daily and if not start Vitamin D 2000 IU daily.  

## 2017-07-01 NOTE — Assessment & Plan Note (Addendum)
TSH WNL today

## 2017-07-01 NOTE — Patient Instructions (Signed)
Warts Warts are small growths on the skin. They are common and can occur on various areas of the body. A person may have one wart or multiple warts. Most warts are not painful, and they usually do not cause problems. However, warts can cause pain if they are large or occur in an area of the body where pressure will be applied to them, such as the bottom of the foot. In many cases, warts do not require treatment. They usually go away on their own over a period of many months to a couple years. Various treatments may be done for warts that cause problems or do not go away. Sometimes, warts go away and then come back again. What are the causes? Warts are caused by a type of virus that is called human papillomavirus (HPV). This virus can spread from person to person through direct contact. Warts can also spread to other areas of the body when a person scratches a wart and then scratches another area of his or her body. What increases the risk? Warts are more likely to develop in:  People who are 10-20 years of age.  People who have a weakened body defense system (immune system).  What are the signs or symptoms? A wart may be round or oval or have an irregular shape. Most warts have a rough surface. Warts may range in color from skin color to light yellow, brown, or gray. They are generally less than  inch (1.3 cm) in size. Most warts are painless, but some can be painful when pressure is applied to them. How is this diagnosed? A wart can usually be diagnosed from its appearance. In some cases, a tissue sample may be removed (biopsy) to be looked at under a microscope. How is this treated? In many cases, warts do not need treatment. If treatment is needed, options may include:  Applying medicated solutions, creams, or patches to the wart. These may be over-the-counter or prescription medicines that make the skin soft so that layers will gradually shed away. In many cases, the medicine is applied one  or two times per day and covered with a bandage.  Putting duct tape over the top of the wart (occlusion). You will leave the tape in place for as long as told by your health care provider, then you will replace it with a new strip of tape. This is done until the wart goes away.  Freezing the wart with liquid nitrogen (cryotherapy).  Burning the wart with: ? Laser treatment. ? An electrified probe (electrocautery).  Injection of a medicine (Candida antigen) into the wart to help the body's immune system to fight off the wart.  Surgery to remove the wart.  Follow these instructions at home:  Apply over-the-counter and prescription medicines only as told by your health care provider.  Do not apply over-the-counter wart medicines to your face or genitals before you ask your health care provider if it is okay to do so.  Do not scratch or pick at a wart.  Wash your hands after you touch a wart.  Avoid shaving hair that is over a wart.  Keep all follow-up visits as told by your health care provider. This is important. Contact a health care provider if:  Your warts do not improve after treatment.  You have redness, swelling, or pain at the site of a wart.  You have bleeding from a wart that does not stop with light pressure.  You have diabetes and you develop a   wart. This information is not intended to replace advice given to you by your health care provider. Make sure you discuss any questions you have with your health care provider. Document Released: 07/15/2005 Document Revised: 03/18/2016 Document Reviewed: 12/31/2014 Elsevier Interactive Patient Education  2018 Elsevier Inc.  

## 2017-07-01 NOTE — Assessment & Plan Note (Signed)
Referred to derm, 3 lesions on right hand, largest on thumb, small on 2nd and 5 th fingers. Left hand large on palm and small lesion on fifth finger. Also with 2 lesions one on each thigh, likely benign but will have dermatology evaluate

## 2017-07-02 MED ORDER — VITAMIN D (ERGOCALCIFEROL) 1.25 MG (50000 UNIT) PO CAPS: 50000.0000 [IU] | ORAL_CAPSULE | ORAL | 4 refills | Status: DC

## 2017-07-02 NOTE — Progress Notes (Signed)
Patient notified of results he voiced his understanding.    PC

## 2017-07-04 NOTE — Assessment & Plan Note (Signed)
Encouraged to get adequate exercise, calcium and vitamin d intake 

## 2017-07-04 NOTE — Progress Notes (Signed)
Patient ID: Brandon Pitts, male   DOB: Jun 21, 1996, 21 y.o.   MRN: [id number]   Subjective:    Patient ID: Brandon Pitts, male    DOB: June 11, 1996, 21 y.o.   MRN: [id number]  Chief Complaint  Patient presents with  . Verrucous Vulgaris    Pt has warts on his hands. Onset a couple of years ago. Wants to know if he needs to go to [facility]  . Sore    Pt has sore (bump) on right thigh he states hes been "picking at for about a year    HPI Patient is in today for evaluation of some skin lesions and routine care. He notes 3 warts on right hand and 2 warts on left hand. Has 1 lesion on each thigh. No recent febrile illness or acute concerns. Denies CP/palp/SOB/HA/congestion/fevers/GI or GU c/o. Taking meds as prescribed  Past Medical History:  Diagnosis Date  . ADHD (attention deficit hyperactivity disorder)   . Allergy   . Insomnia 08/03/2012  . Low bone density for age 35/11/2011  . Verrucous skin lesion 07/01/2017  . WCC (well child check) 06/20/2012    Past Surgical History:  Procedure Laterality Date  . FRACTURE SURGERY     pin in left elbow, removed  . WISDOM TOOTH EXTRACTION      Family History  Problem Relation Age of Onset  . Hypothyroidism Mother   . Cancer Father        testicular, mid 32s  . Hyperlipidemia Father   . Hypertension Father   . ADD / ADHD Sister   . Cancer Maternal Grandfather        breast  . Hypertension Maternal Grandfather   . Hyperlipidemia Maternal Grandfather   . Seizures Paternal Grandmother   . Hypertension Paternal Grandmother   . Hypertension Paternal Grandfather        uncontrolled  . Cancer Paternal Grandfather        precancer of colon s/p partial colectomy    Social History   Social History  . Marital status: Single    Spouse name: N/A  . Number of children: N/A  . Years of education: N/A   Occupational History  . Not on file.   Social History Main Topics  . Smoking status: Never Smoker  . Smokeless tobacco: Never Used  . Alcohol use  No  . Drug use: No  . Sexual activity: Yes    Partners: Female   Other Topics Concern  . Not on file   Social History Narrative  . No narrative on file    Outpatient Medications Prior to Visit  Medication Sig Dispense Refill  . clarithromycin (BIAXIN XL) 500 MG 24 hr tablet Take 2 tablets (1,000 mg total) by mouth daily. 14 tablet 0  . nystatin (MYCOSTATIN) 100000 UNIT/ML suspension Take 5 mLs (500,000 Units total) by mouth 4 (four) times daily. 60 mL 0  . triamcinolone cream (KENALOG) 0.1 % Apply 1 application topically 2 (two) times daily. 30 g 0   No facility-administered medications prior to visit.     Allergies  Allergen Reactions  . Penicillins Rash    rash  . Doxycycline Rash    Review of Systems  Constitutional: Negative for fever and malaise/fatigue.  HENT: Negative for congestion.   Eyes: Negative for blurred vision.  Respiratory: Negative for shortness of breath.   Cardiovascular: Negative for chest pain, palpitations and leg swelling.  Gastrointestinal: Negative for abdominal pain, blood in stool and nausea.  Genitourinary: Negative for dysuria  and frequency.  Musculoskeletal: Negative for falls.  Skin: Positive for rash.  Neurological: Negative for dizziness, loss of consciousness and headaches.  Endo/Heme/Allergies: Negative for environmental allergies.  Psychiatric/Behavioral: Negative for depression. The patient is not nervous/anxious.        Objective:    Physical Exam  Constitutional: He is oriented to person, place, and time. He appears well-developed and well-nourished. No distress.  HENT:  Head: Normocephalic and atraumatic.  Nose: Nose normal.  Eyes: Right eye exhibits no discharge. Left eye exhibits no discharge.  Neck: Normal range of motion. Neck supple.  Cardiovascular: Normal rate and regular rhythm.   No murmur heard. Pulmonary/Chest: Effort normal and breath sounds normal.  Abdominal: Soft. Bowel sounds are normal. There is no  tenderness.  Musculoskeletal: He exhibits no edema.  Neurological: He is alert and oriented to person, place, and time.  Skin: Skin is warm and dry.  3 lesions on right hand, 2 lesions on left hand. Left one on palp, one on 5th finger. On right largest on thumb and one on 2nd and one on 5th fingers. Also 2 benign appearing lesions one on each thigh  Psychiatric: He has a normal mood and affect.  Nursing note and vitals reviewed.   BP 129/67   Pulse 66   Temp 97.7 F (36.5 C) (Oral)   Ht  (1.803 m)   Wt 141 lb (64 kg)   SpO2 99%   BMI 19.67 kg/m  Wt Readings from Last 3 Encounters:  07/01/17 141 lb (64 kg)  05/11/16 140 lb 6.4 oz (63.7 kg)  03/17/16 146 lb (66.2 kg)     Lab Results  Component Value Date   WBC 3.3 (L) 03/17/2016   HGB 15.2 03/17/2016   HCT 44.8 03/17/2016   PLT 124.0 (L) 03/17/2016   GLUCOSE 66 (L) 07/01/2017   ALT 12 07/01/2017   AST 14 07/01/2017   NA 142 07/01/2017   K 4.1 07/01/2017   CL 104 07/01/2017   CREATININE 1.08 07/01/2017   BUN 17 07/01/2017   CO2 32 07/01/2017   TSH 0.96 07/01/2017    Lab Results  Component Value Date   TSH 0.96 07/01/2017   Lab Results  Component Value Date   WBC 3.3 (L) 03/17/2016   HGB 15.2 03/17/2016   HCT 44.8 03/17/2016   MCV 91.4 03/17/2016   PLT 124.0 (L) 03/17/2016   Lab Results  Component Value Date   NA 142 07/01/2017   K 4.1 07/01/2017   CO2 32 07/01/2017   GLUCOSE 66 (L) 07/01/2017   BUN 17 07/01/2017   CREATININE 1.08 07/01/2017   BILITOT 1.2 07/01/2017   ALKPHOS 81 07/01/2017   AST 14 07/01/2017   ALT 12 07/01/2017   PROT 6.6 07/01/2017   ALBUMIN 4.7 07/01/2017   CALCIUM 10.1 07/01/2017   GFR 91.06 07/01/2017       Assessment & Plan:   Problem List Items Addressed This Visit    Goiter    TSH WNL today       Relevant Orders   TSH (Completed)   Vitamin D deficiency    Labs reveal deficiency. Start on Vitamin D 21308 IU caps, 1 cap po weekly x 12 weeks. Disp #4 with 4  rf. Also take daily Vitamin D over the counter. If already taking a daily supplement increase by 1000 IU daily and if not start Vitamin D 2000 IU daily.       Relevant Orders   VITAMIN D 25  Hydroxy (Vit-D Deficiency, Fractures) (Completed)   Comprehensive metabolic panel (Completed)   Low bone density for age    Encouraged to get adequate exercise, calcium and vitamin d intake      Verrucous skin lesion    Referred to derm, 3 lesions on right hand, largest on thumb, small on 2nd and 5 th fingers. Left hand large on palm and small lesion on fifth finger. Also with 2 lesions one on each thigh, likely benign but will have dermatology evaluate      Relevant Orders   Ambulatory referral to Dermatology    Other Visit Diagnoses    H/O oral aphthous ulcers       Relevant Medications   nystatin (MYCOSTATIN) 100000 UNIT/ML suspension      I have discontinued Mr. Demarais triamcinolone cream and clarithromycin. I am also having him start on Vitamin D (Ergocalciferol). Additionally, I am having him maintain his nystatin.  Meds ordered this encounter  Medications  . nystatin (MYCOSTATIN) 100000 UNIT/ML suspension    Sig: Take 5 mLs (500,000 Units total) by mouth 4 (four) times daily.    Dispense:  60 mL    Refill:  2  . Vitamin D, Ergocalciferol, (DRISDOL) 50000 units CAPS capsule    Sig: Take 1 capsule (50,000 Units total) by mouth every 7 (seven) days.    Dispense:  4 capsule    Refill:  4    CMA served as scribe during this visit. History, Physical and Plan performed by medical provider. Documentation and orders reviewed and attested to.  Danise Edge, MD

## 2017-07-25 ENCOUNTER — Encounter: Payer: Self-pay | Admitting: Family Medicine

## 2017-08-05 DIAGNOSIS — B078 Other viral warts: Secondary | ICD-10-CM | POA: Diagnosis not present

## 2017-08-05 DIAGNOSIS — Z23 Encounter for immunization: Secondary | ICD-10-CM | POA: Diagnosis not present

## 2017-08-05 DIAGNOSIS — D2371 Other benign neoplasm of skin of right lower limb, including hip: Secondary | ICD-10-CM | POA: Diagnosis not present

## 2017-12-02 ENCOUNTER — Other Ambulatory Visit: Payer: Self-pay | Admitting: Family Medicine

## 2018-04-05 ENCOUNTER — Encounter: Payer: Self-pay | Admitting: Internal Medicine

## 2018-04-05 ENCOUNTER — Ambulatory Visit: Payer: BLUE CROSS/BLUE SHIELD | Admitting: Internal Medicine

## 2018-04-05 ENCOUNTER — Ambulatory Visit: Payer: Self-pay

## 2018-04-05 VITALS — BP 124/70 | HR 87 | Temp 98.0°F | Resp 16 | Ht 71.0 in | Wt 145.0 lb

## 2018-04-05 DIAGNOSIS — N50811 Right testicular pain: Secondary | ICD-10-CM

## 2018-04-05 NOTE — Progress Notes (Signed)
Subjective:    Patient ID: Brandon Pitts, male    DOB: [birth year], 22 y.o.   MRN: [id number]  DOS:  04/05/2018 Type of visit - description : acute Interval history: here with his girlfriend of 5 years, they are on a monogamous relationship. His concern is a 23-month history of dull right testicular discomfort, mostly at the posterior upper aspect of the testicle. For the last 10 days he has had discomfort on and off daily. He is concerned because his father had testicular cancer in his 17s.  Yesterday, he developed a ill defined lower abdominal discomfort and some diarrhea.   Review of Systems  Denies any penile discharge, dysuria or gross hematuria. No genital rash No history of previous STD.  No high risk behaviors in the last 5 years. Denies fever, chills.  Appetite is normal.  Past Medical History:  Diagnosis Date  . ADHD (attention deficit hyperactivity disorder)   . Allergy   . Insomnia 08/03/2012  . Low bone density for age 41/11/2011  . Verrucous skin lesion 07/01/2017  . WCC (well child check) 06/20/2012    Past Surgical History:  Procedure Laterality Date  . FRACTURE SURGERY     pin in left elbow, removed  . WISDOM TOOTH EXTRACTION      Social History   Socioeconomic History  . Marital status: Single    Spouse name: Not on file  . Number of children: Not on file  . Years of education: Not on file  . Highest education level: Not on file  Occupational History  . Not on file  Social Needs  . Financial resource strain: Not on file  . Food insecurity:    Worry: Not on file    Inability: Not on file  . Transportation needs:    Medical: Not on file    Non-medical: Not on file  Tobacco Use  . Smoking status: Never Smoker  . Smokeless tobacco: Never Used  Substance and Sexual Activity  . Alcohol use: No    Alcohol/week: 0.0 oz  . Drug use: No  . Sexual activity: Yes    Partners: Female  Lifestyle  . Physical activity:    Days per week: Not on file   Minutes per session: Not on file  . Stress: Not on file  Relationships  . Social connections:    Talks on phone: Not on file    Gets together: Not on file    Attends religious service: Not on file    Active member of club or organization: Not on file    Attends meetings of clubs or organizations: Not on file    Relationship status: Not on file  . Intimate partner violence:    Fear of current or ex partner: Not on file    Emotionally abused: Not on file    Physically abused: Not on file    Forced sexual activity: Not on file  Other Topics Concern  . Not on file  Social History Narrative  . Not on file      Allergies as of 04/05/2018      Reactions   Penicillins Rash   rash   Doxycycline Rash      Medication List        Accurate as of 04/05/18 11:59 PM. Always use your most recent med list.          nystatin 100000 UNIT/ML suspension Commonly known as:  MYCOSTATIN Take 5 mLs (500,000 Units total) by mouth 4 (four)  times daily.   Vitamin D (Ergocalciferol) 50000 units Caps capsule Commonly known as:  DRISDOL TAKE 1 CAPSULE (50,000 UNITS TOTAL) BY MOUTH EVERY 7 (SEVEN) DAYS.          Objective:   Physical Exam BP 124/70 (BP Location: Left Arm, Patient Position: Sitting, Cuff Size: Small)   Pulse 87   Temp 98 F (36.7 C) (Oral)   Resp 16   Ht 5\' 11"  (1.803 m)   Wt 145 lb (65.8 kg)   SpO2 98%   BMI 20.22 kg/m  General:   Well developed, NAD, see BMI.  HEENT:  Normocephalic . Face symmetric, atraumatic  Abdomen:  Not distended, soft, non-tender. No rebound or rigidity.   GU: Penis normal, circumcised, no lesions, no discharge Scrotal contents normal, no particular tenderness, testicles symmetric, not nodular or tender. Groins: No hernias or LADs. Skin: Not pale. Not jaundice Neurologic:  alert & oriented X3.  Speech normal, gait appropriate for age and unassisted Psych--  Cognition and judgment appear intact.  Cooperative with normal attention span  and concentration.  Behavior appropriate. No anxious or depressed appearing.     Assessment & Plan:   22 year old male presents with Right testicular pain: Going on for 2 months, exam is benign, low suspicion for STDs, he does have a family history of testicular cancer. We will proceed with a UA, urine culture, G&C and ultrasound. If all is normal I will recommend observation for now, however if he continue experiencing this problem he will call, refer to urology?. He verbalized understanding.

## 2018-04-05 NOTE — Telephone Encounter (Signed)
Pt. Reports he noticed "some heaviness " to the right testicle x 2 months on and off. Seems to be getting worse over the past week. "Slight" abdominal discomfort. No other symptoms.Appointment made for today.  Reason for Disposition . [1] Pain comes and goes (intermittent) AND [2] present > 24 hours  Answer Assessment - Initial Assessment Questions 1. LOCATION and RADIATION: "Where is the pain located?"      Right side 2. QUALITY: "What does the pain feel like?"  (e.g., sharp, dull, aching, burning)     Heaviness 3. SEVERITY: "How bad is the pain?"  (Scale 1-10; or mild, moderate, severe)   - MILD (1-3): doesn't interfere with normal activities    - MODERATE (4-7): interferes with normal activities (e.g., work or school) or awakens from sleep   - SEVERE (8-10): excruciating pain, unable to do any normal activities, difficulty walking     Mild 4. ONSET: "When did the pain start?"     Started 2 months 5. PATTERN: "Does it come and go, or has it been constant since it started?"     Comes and goes 6. SCROTAL APPEARANCE: "What does the scrotum look like?" "Is there any swelling or redness?"      Maybe a little swollen 7. HERNIA: "Has a doctor ever told you that you have a hernia?"     No 8. OTHER SYMPTOMS: "Do you have any other symptoms?" (e.g., fever, abdominal pain, vomiting, difficulty passing urine)     Slight abdominal pain  Protocols used: SCROTAL PAIN-A-AH

## 2018-04-05 NOTE — Patient Instructions (Signed)
GO TO THE LAB : Provide a urine sample  We will schedule a ultrasound  If you are not improving gradually please let us know

## 2018-04-05 NOTE — Progress Notes (Signed)
Pre visit review using our clinic review tool, if applicable. No additional management support is needed unless otherwise documented below in the visit note. 

## 2018-04-06 ENCOUNTER — Telehealth: Payer: Self-pay | Admitting: Family Medicine

## 2018-04-06 ENCOUNTER — Ambulatory Visit (HOSPITAL_BASED_OUTPATIENT_CLINIC_OR_DEPARTMENT_OTHER)
Admission: RE | Admit: 2018-04-06 | Discharge: 2018-04-06 | Disposition: A | Discharge status: Home / Self Care | Payer: BLUE CROSS/BLUE SHIELD | Source: Ambulatory Visit | Attending: Internal Medicine | Admitting: Internal Medicine

## 2018-04-06 DIAGNOSIS — N50811 Right testicular pain: Secondary | ICD-10-CM

## 2018-04-06 DIAGNOSIS — N433 Hydrocele, unspecified: Secondary | ICD-10-CM | POA: Insufficient documentation

## 2018-04-06 DIAGNOSIS — Z8043 Family history of malignant neoplasm of testis: Secondary | ICD-10-CM

## 2018-04-06 LAB — URINE CULTURE
MICRO NUMBER: 90728242
RESULT: NO GROWTH
SPECIMEN QUALITY: ADEQUATE

## 2018-04-06 LAB — URINALYSIS, ROUTINE W REFLEX MICROSCOPIC
Bilirubin Urine: NEGATIVE
Hgb urine dipstick: NEGATIVE
Ketones, ur: NEGATIVE
Leukocytes, UA: NEGATIVE
NITRITE: NEGATIVE
RBC / HPF: NONE SEEN (ref 0–?)
SPECIFIC GRAVITY, URINE: 1.02 (ref 1.000–1.030)
Total Protein, Urine: NEGATIVE
URINE GLUCOSE: NEGATIVE
Urobilinogen, UA: 1 (ref 0.0–1.0)
WBC, UA: NONE SEEN (ref 0–?)
pH: 6.5 (ref 5.0–8.0)

## 2018-04-06 LAB — C. TRACHOMATIS/N. GONORRHOEAE RNA
C. trachomatis RNA, TMA: NOT DETECTED
N. gonorrhoeae RNA, TMA: NOT DETECTED

## 2018-04-06 NOTE — Telephone Encounter (Signed)
Copied from CRM [phone]. Topic: Referral - Request >> Apr 06, 2018  4:59 PM Stephannie LiSimmons, Jabree Pernice L, NT wrote: Reason for CRM: Patient was told during last visit to request a referral to see a urologist please call [phone]

## 2018-04-07 NOTE — Telephone Encounter (Signed)
Work-up was negative, I have recommended a referral if he is doing gradually better.  If he prefers not to wait, please arrange a urology referral..

## 2018-04-07 NOTE — Telephone Encounter (Signed)
Referral placed.

## 2018-04-07 NOTE — Telephone Encounter (Signed)
Pt seen on 04/05/2018 w/ Dr. Drue NovelPaz for R testicular pain. Please advise.

## 2018-04-12 ENCOUNTER — Ambulatory Visit: Payer: BLUE CROSS/BLUE SHIELD | Admitting: Family Medicine

## 2018-04-28 ENCOUNTER — Encounter: Payer: Self-pay | Admitting: Family Medicine

## 2018-04-29 NOTE — Telephone Encounter (Signed)
Pt. requesting copy of IMM record. [facility], Dr. Mariel AloeBlyth's CMA.

## 2018-07-04 ENCOUNTER — Ambulatory Visit (INDEPENDENT_AMBULATORY_CARE_PROVIDER_SITE_OTHER): Payer: BLUE CROSS/BLUE SHIELD | Admitting: Family Medicine

## 2018-07-04 ENCOUNTER — Encounter: Payer: Self-pay | Admitting: Family Medicine

## 2018-07-04 ENCOUNTER — Encounter: Payer: BLUE CROSS/BLUE SHIELD | Admitting: Family Medicine

## 2018-07-04 VITALS — BP 116/60 | HR 73 | Temp 98.3°F | Resp 18 | Ht 71.0 in | Wt 152.2 lb

## 2018-07-04 DIAGNOSIS — Z23 Encounter for immunization: Secondary | ICD-10-CM | POA: Diagnosis not present

## 2018-07-04 DIAGNOSIS — Z0000 Encounter for general adult medical examination without abnormal findings: Secondary | ICD-10-CM

## 2018-07-04 NOTE — Patient Instructions (Signed)
Preventive Care 18-39 Years, Male Preventive care refers to lifestyle choices and visits with your health care provider that can promote health and wellness. What does preventive care include?  A yearly physical exam. This is also called an annual well check.  Dental exams once or twice a year.  Routine eye exams. Ask your health care provider how often you should have your eyes checked.  Personal lifestyle choices, including: ? Daily care of your teeth and gums. ? Regular physical activity. ? Eating a healthy diet. ? Avoiding tobacco and drug use. ? Limiting alcohol use. ? Practicing safe sex. What happens during an annual well check? The services and screenings done by your health care provider during your annual well check will depend on your age, overall health, lifestyle risk factors, and family history of disease. Counseling Your health care provider may ask you questions about your:  Alcohol use.  Tobacco use.  Drug use.  Emotional well-being.  Home and relationship well-being.  Sexual activity.  Eating habits.  Work and work Statistician.  Screening You may have the following tests or measurements:  Height, weight, and BMI.  Blood pressure.  Lipid and cholesterol levels. These may be checked every 5 years starting at age 34.  Diabetes screening. This is done by checking your blood sugar (glucose) after you have not eaten for a while (fasting).  Skin check.  Hepatitis C blood test.  Hepatitis B blood test.  Sexually transmitted disease (STD) testing.  Discuss your test results, treatment options, and if necessary, the need for more tests with your health care provider. Vaccines Your health care provider may recommend certain vaccines, such as:  Influenza vaccine. This is recommended every year.  Tetanus, diphtheria, and acellular pertussis (Tdap, Td) vaccine. You may need a Td booster every 10 years.  Varicella vaccine. You may need this if you  have not been vaccinated.  HPV vaccine. If you are 23 or younger, you may need three doses over 6 months.  Measles, mumps, and rubella (MMR) vaccine. You may need at least one dose of MMR.You may also need a second dose.  Pneumococcal 13-valent conjugate (PCV13) vaccine. You may need this if you have certain conditions and have not been vaccinated.  Pneumococcal polysaccharide (PPSV23) vaccine. You may need one or two doses if you smoke cigarettes or if you have certain conditions.  Meningococcal vaccine. One dose is recommended if you are age 65-21 years and a first-year college student living in a residence hall, or if you have one of several medical conditions. You may also need additional booster doses.  Hepatitis A vaccine. You may need this if you have certain conditions or if you travel or work in places where you may be exposed to hepatitis A.  Hepatitis B vaccine. You may need this if you have certain conditions or if you travel or work in places where you may be exposed to hepatitis B.  Haemophilus influenzae type b (Hib) vaccine. You may need this if you have certain risk factors.  Talk to your health care provider about which screenings and vaccines you need and how often you need them. This information is not intended to replace advice given to you by your health care provider. Make sure you discuss any questions you have with your health care provider. Document Released: 12/01/2001 Document Revised: 06/24/2016 Document Reviewed: 08/06/2015 Elsevier Interactive Patient Education  [employer].

## 2018-07-06 NOTE — Assessment & Plan Note (Signed)
Patient encouraged to maintain heart healthy diet, regular exercise, adequate sleep. Consider daily probiotics. Take medications as prescribed 

## 2018-07-06 NOTE — Progress Notes (Signed)
Subjective:    Patient ID: Brandon Pitts, male    DOB: Jan 26, 1996, 22 y.o.   MRN: [id number]  Chief Complaint  Patient presents with  . Annual Exam    HPI Patient is in today for annual preventative exam and overall he feels well. No recent febrile illness or hospitalizations. No acute concerns. Woks full time. Stays active and does eat processed foods with some regularity. Denies CP/palp/SOB/HA/congestion/fevers/GI or GU c/o. Taking meds as prescribed  Past Medical History:  Diagnosis Date  . ADHD (attention deficit hyperactivity disorder)   . Allergy   . Insomnia 08/03/2012  . Low bone density for age 66/11/2011  . Verrucous skin lesion 07/01/2017  . WCC (well child check) 06/20/2012    Past Surgical History:  Procedure Laterality Date  . FRACTURE SURGERY     pin in left elbow, removed  . WISDOM TOOTH EXTRACTION      Family History  Problem Relation Age of Onset  . Hypothyroidism Mother   . Cancer Father        testicular, mid 55s  . Hyperlipidemia Father   . Hypertension Father   . ADD / ADHD Sister   . Cancer Maternal Grandmother        stage 4 lung  . Deep vein thrombosis Maternal Grandmother   . Stroke Maternal Grandmother   . Cancer Maternal Grandfather        breast  . Hypertension Maternal Grandfather   . Hyperlipidemia Maternal Grandfather   . Seizures Paternal Grandmother   . Hypertension Paternal Grandmother   . Hypertension Paternal Grandfather        uncontrolled  . Cancer Paternal Grandfather        precancer of colon s/p partial colectomy    Social History   Socioeconomic History  . Marital status: Single    Spouse name: Not on file  . Number of children: Not on file  . Years of education: Not on file  . Highest education level: Not on file  Occupational History  . Not on file  Social Needs  . Financial resource strain: Not on file  . Food insecurity:    Worry: Not on file    Inability: Not on file  . Transportation needs:    Medical:  Not on file    Non-medical: Not on file  Tobacco Use  . Smoking status: Never Smoker  . Smokeless tobacco: Never Used  Substance and Sexual Activity  . Alcohol use: No    Alcohol/week: 0.0 standard drinks  . Drug use: No  . Sexual activity: Yes    Partners: Female  Lifestyle  . Physical activity:    Days per week: Not on file    Minutes per session: Not on file  . Stress: Not on file  Relationships  . Social connections:    Talks on phone: Not on file    Gets together: Not on file    Attends religious service: Not on file    Active member of club or organization: Not on file    Attends meetings of clubs or organizations: Not on file    Relationship status: Not on file  . Intimate partner violence:    Fear of current or ex partner: Not on file    Emotionally abused: Not on file    Physically abused: Not on file    Forced sexual activity: Not on file  Other Topics Concern  . Not on file  Social History Narrative  . Not  on file    Outpatient Medications Prior to Visit  Medication Sig Dispense Refill  . nystatin (MYCOSTATIN) 100000 UNIT/ML suspension Take 5 mLs (500,000 Units total) by mouth 4 (four) times daily. 60 mL 2  . Vitamin D, Ergocalciferol, (DRISDOL) 50000 units CAPS capsule TAKE 1 CAPSULE (50,000 UNITS TOTAL) BY MOUTH EVERY 7 (SEVEN) DAYS. 4 capsule 4   No facility-administered medications prior to visit.     Allergies  Allergen Reactions  . Penicillins Rash    rash  . Doxycycline Rash    Review of Systems  Constitutional: Negative for chills, fever and malaise/fatigue.  HENT: Negative for congestion and hearing loss.   Eyes: Negative for discharge.  Respiratory: Negative for cough, sputum production and shortness of breath.   Cardiovascular: Negative for chest pain, palpitations and leg swelling.  Gastrointestinal: Negative for abdominal pain, blood in stool, constipation, diarrhea, heartburn, nausea and vomiting.  Genitourinary: Negative for dysuria,  frequency, hematuria and urgency.  Musculoskeletal: Negative for back pain, falls and myalgias.  Skin: Negative for rash.  Neurological: Negative for dizziness, sensory change, loss of consciousness, weakness and headaches.  Endo/Heme/Allergies: Negative for environmental allergies. Does not bruise/bleed easily.  Psychiatric/Behavioral: Negative for depression and suicidal ideas. The patient is not nervous/anxious and does not have insomnia.        Objective:    Physical Exam  Constitutional: He is oriented to person, place, and time. He appears well-developed and well-nourished. No distress.  HENT:  Head: Normocephalic and atraumatic.  Nose: Nose normal.  Eyes: Pupils are equal, round, and reactive to light. Conjunctivae and EOM are normal. Right eye exhibits no discharge. Left eye exhibits no discharge.  Neck: Normal range of motion. Neck supple. No thyromegaly present.  Cardiovascular: Normal rate and regular rhythm.  No murmur heard. Pulmonary/Chest: Effort normal and breath sounds normal.  Abdominal: Soft. Bowel sounds are normal. He exhibits no distension and no mass. There is no tenderness. There is no guarding.  Musculoskeletal: He exhibits no edema.  Neurological: He is alert and oriented to person, place, and time. He displays normal reflexes. No cranial nerve deficit.  Skin: Skin is warm and dry. No erythema.  Psychiatric: He has a normal mood and affect.  Nursing note and vitals reviewed.   BP 116/60 (BP Location: Left Arm, Patient Position: Sitting, Cuff Size: Normal)   Pulse 73   Temp 98.3 F (36.8 C) (Oral)   Resp 18   Ht 5\' 11"  (1.803 m)   Wt 152 lb 3.2 oz (69 kg)   SpO2 98%   BMI 21.23 kg/m  Wt Readings from Last 3 Encounters:  07/04/18 152 lb 3.2 oz (69 kg)  04/05/18 145 lb (65.8 kg)  07/01/17 141 lb (64 kg)     Lab Results  Component Value Date   WBC 3.3 (L) 03/17/2016   HGB 15.2 03/17/2016   HCT 44.8 03/17/2016   PLT 124.0 (L) 03/17/2016    GLUCOSE 66 (L) 07/01/2017   ALT 12 07/01/2017   AST 14 07/01/2017   NA 142 07/01/2017   K 4.1 07/01/2017   CL 104 07/01/2017   CREATININE 1.08 07/01/2017   BUN 17 07/01/2017   CO2 32 07/01/2017   TSH 0.96 07/01/2017    Lab Results  Component Value Date   TSH 0.96 07/01/2017   Lab Results  Component Value Date   WBC 3.3 (L) 03/17/2016   HGB 15.2 03/17/2016   HCT 44.8 03/17/2016   MCV 91.4 03/17/2016   PLT 124.0 (  L) 03/17/2016   Lab Results  Component Value Date   NA 142 07/01/2017   K 4.1 07/01/2017   CO2 32 07/01/2017   GLUCOSE 66 (L) 07/01/2017   BUN 17 07/01/2017   CREATININE 1.08 07/01/2017   BILITOT 1.2 07/01/2017   ALKPHOS 81 07/01/2017   AST 14 07/01/2017   ALT 12 07/01/2017   PROT 6.6 07/01/2017   ALBUMIN 4.7 07/01/2017   CALCIUM 10.1 07/01/2017   GFR 91.06 07/01/2017   No results found for: CHOL No results found for: HDL No results found for: LDLCALC No results found for: TRIG No results found for: CHOLHDL No results found for: QION6E     Assessment & Plan:   Problem List Items Addressed This Visit    Preventative health care    Patient encouraged to maintain heart healthy diet, regular exercise, adequate sleep. Consider daily probiotics. Take medications as prescribed       Other Visit Diagnoses    Needs flu shot    -  Primary   Relevant Orders   Flu Vaccine QUAD 6+ mos PF IM (Fluarix Quad PF) (Completed)      I have discontinued Zahki M. Diniz's nystatin and Vitamin D (Ergocalciferol).  No orders of the defined types were placed in this encounter.    Danise Edge, MD

## 2018-10-17 ENCOUNTER — Telehealth: Payer: Self-pay | Admitting: Family Medicine

## 2018-10-17 NOTE — Telephone Encounter (Signed)
Patient's live in girlfriend was diagnosed with the flu and her provider advised the patient to call his provider to be prescribed Tamiflu to prevent the flu.

## 2018-10-17 NOTE — Telephone Encounter (Signed)
Copied from CRM [phone]#[id number]. Topic: Quick Communication - See Telephone Encounter >> Oct 17, 2018 12:19 PM Trula SladeWalter, Linda F wrote: CRM for notification. See Telephone encounter for: 10/17/18. Patient stated that his live in girl friend was just diagnosed with the flu and he was advised to call his primary care provider for a prescription for Tamaflu.  He would like to [facility] [place].

## 2018-10-18 NOTE — Telephone Encounter (Signed)
Please advise 

## 2018-10-20 ENCOUNTER — Telehealth: Payer: Self-pay

## 2018-10-20 ENCOUNTER — Other Ambulatory Visit: Payer: Self-pay | Admitting: Family Medicine

## 2018-10-20 MED ORDER — OSELTAMIVIR PHOSPHATE 75 MG PO CAPS: 75.0000 mg | ORAL_CAPSULE | Freq: Every day | ORAL | 0 refills | Status: DC

## 2018-10-20 NOTE — Telephone Encounter (Signed)
Called patient to let him know Tamiflu had been sent to pharmacy. No answer

## 2018-10-20 NOTE — Telephone Encounter (Signed)
Left detailed message on voicemail that Rx was sent and to call our office if any questions.

## 2018-10-20 NOTE — Telephone Encounter (Signed)
Sent the Tamiflu to his pharmacy in [place] please let him know

## 2019-04-28 IMAGING — US US SCROTUM
1 series · 14 of 25 positions shown · non-contrast
Comparison: None.

CLINICAL DATA: Right-sided testicular pain.

EXAM:
SCROTAL ULTRASOUND
DOPPLER ULTRASOUND OF THE TESTICLES
TECHNIQUE: Complete ultrasound examination of the testicles, epididymis, and
other scrotal structures was performed. Color and spectral Doppler
ultrasound were also utilized to evaluate blood flow to the
testicles.

[Series 1: us scrotum · 0.07mm/px · 14 of 74 slices shown]
[im 1/74]
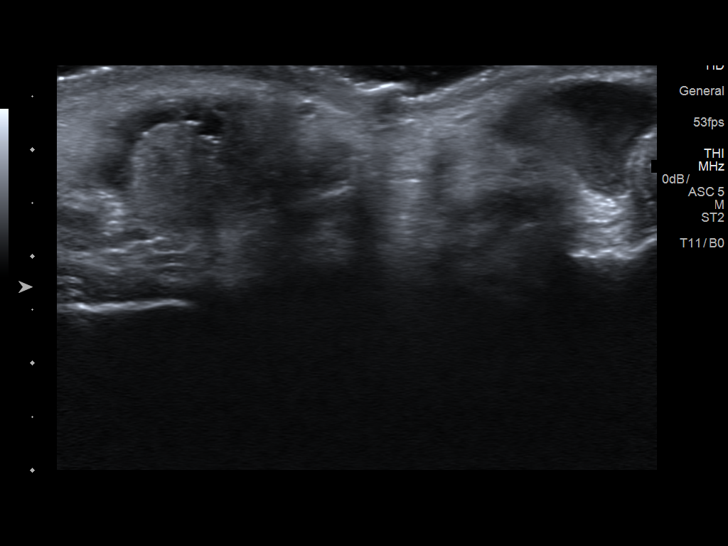
[im 7/74]
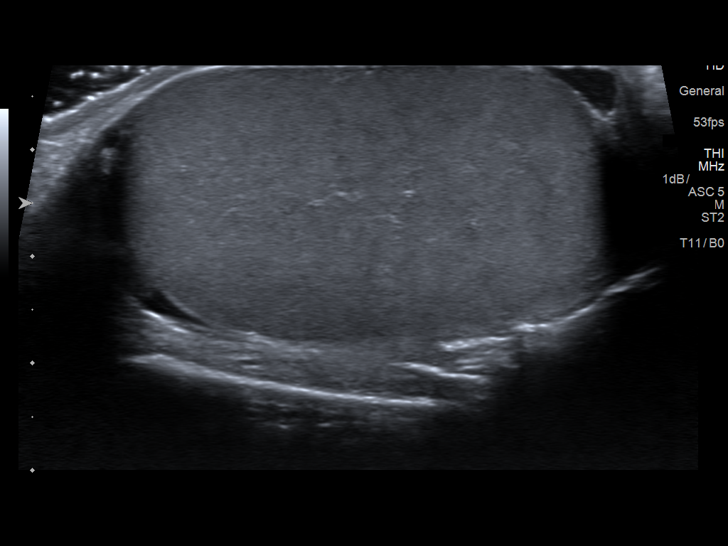
[im 13/74]
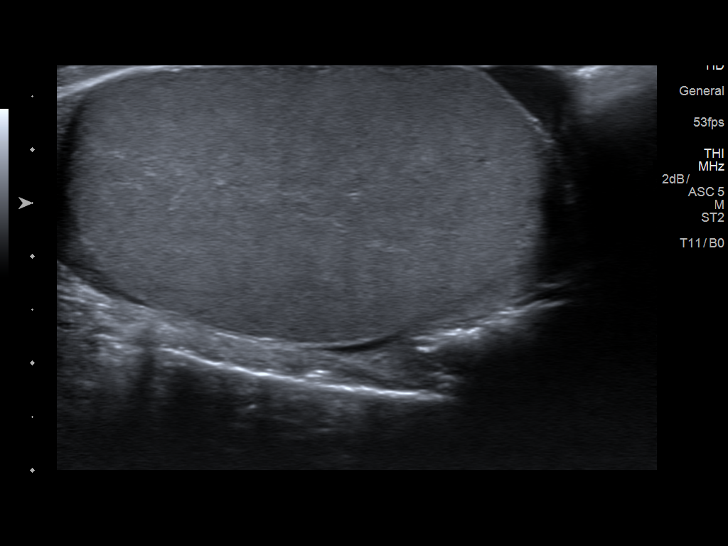
[im 19/74]
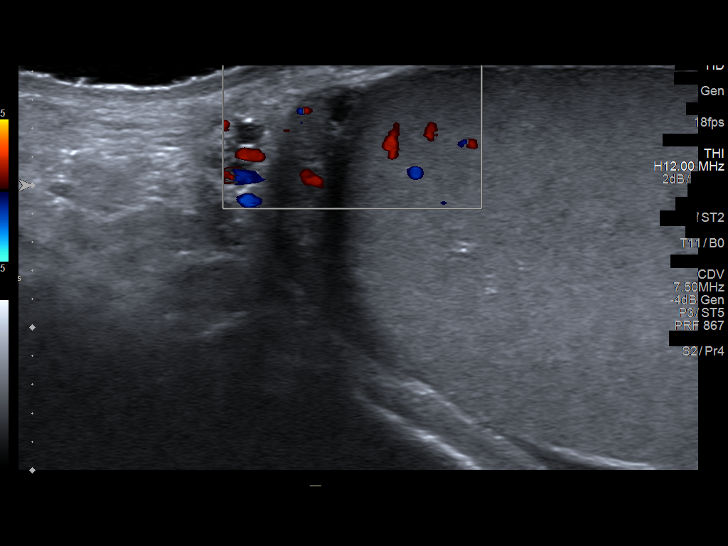
[im 25/74]
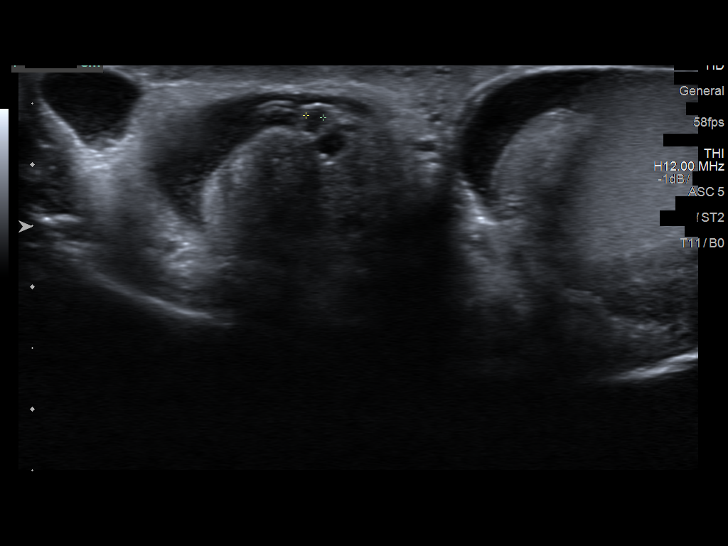
[im 28/74]
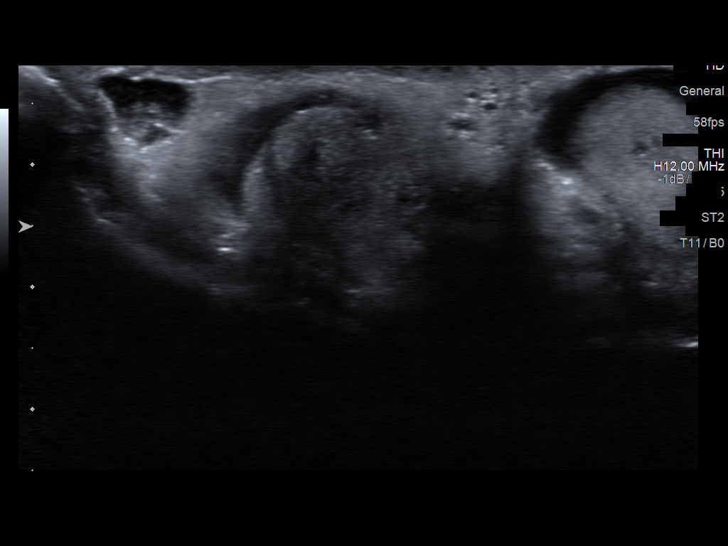
[im 34/74]
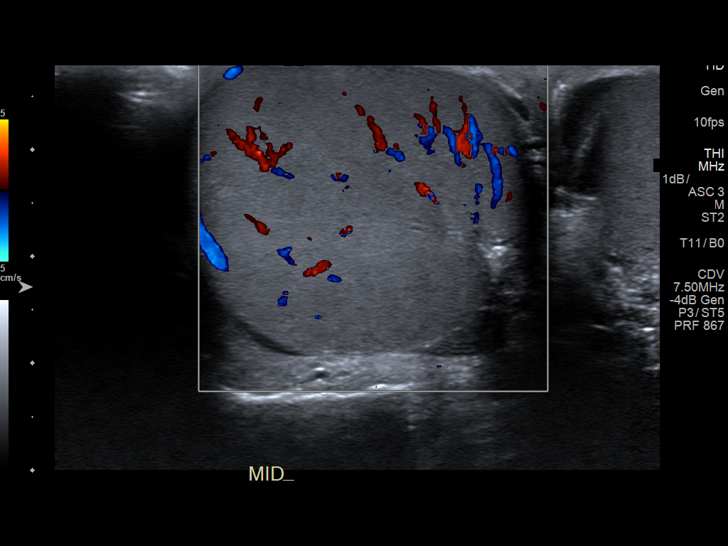
[im 40/74]
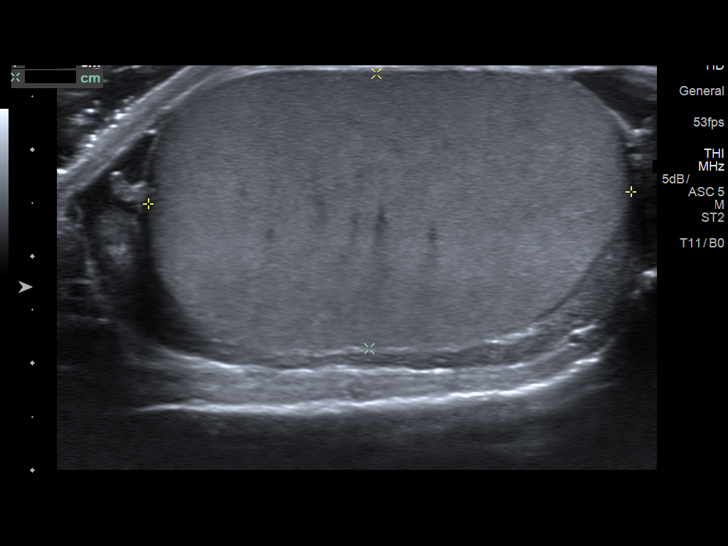
[im 46/74]
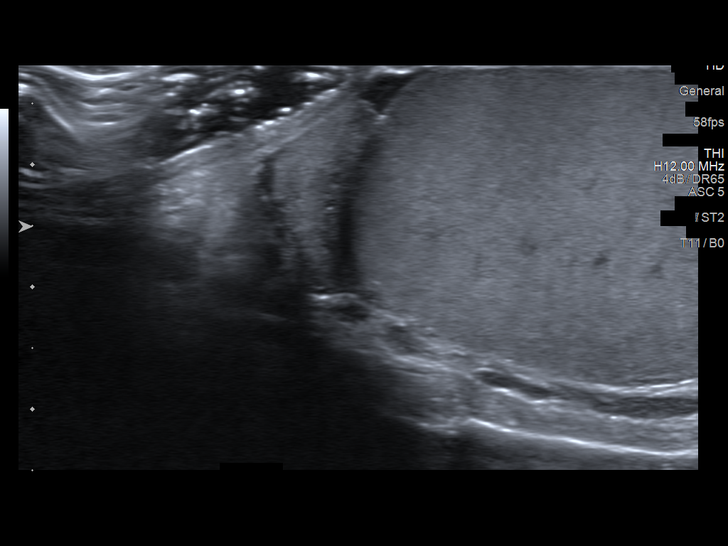
[im 49/74]
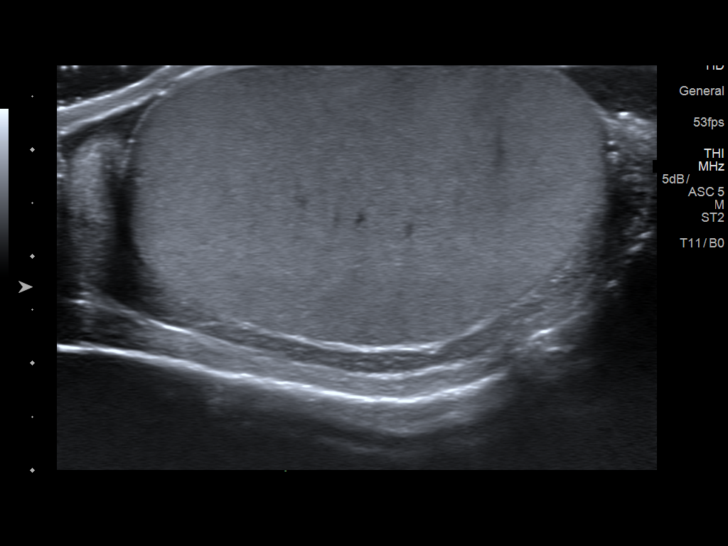
[im 55/74]
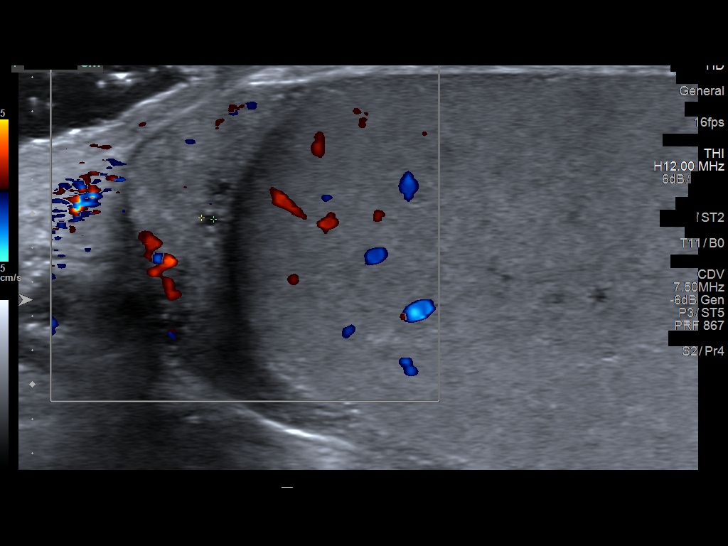
[im 61/74]
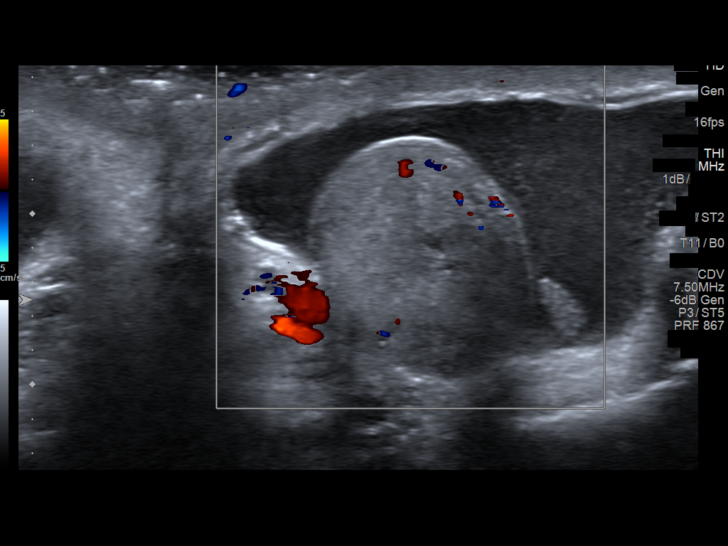
[im 67/74]
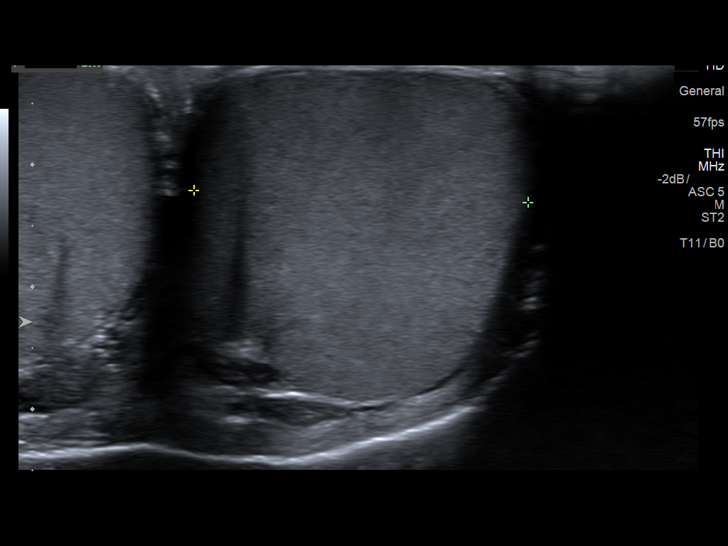
[im 74/74]
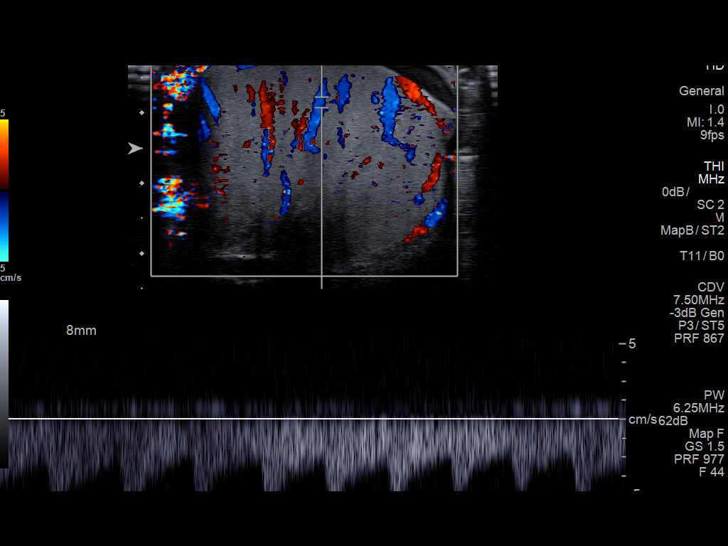

[14 of 25 positions shown; findings below may reference images not displayed]

FINDINGS: Right testicle

Measurements: 4.5 x 2.5 x 3.1 cm. No mass or microlithiasis
visualized.

Left testicle

Measurements: 4.5 x 2.6 x 2.7 cm. No mass or microlithiasis
visualized.

Right epididymis:  Tiny epididymal cysts versus spermatoceles.

Left epididymis:  Tiny epididymal cysts versus spermatoceles.

Hydrocele:  Small bilateral hydroceles.

Varicocele:  None visualized.

Pulsed Doppler interrogation of both testes demonstrates normal low
resistance arterial and venous waveforms bilaterally.
IMPRESSION: 1.  No acute process or explanation for right-sided testicular pain.
2. Tiny epididymal cysts versus spermatoceles.
3. Small bilateral hydroceles.

## 2019-07-07 ENCOUNTER — Encounter: Payer: BLUE CROSS/BLUE SHIELD | Admitting: Family Medicine

## 2019-09-11 ENCOUNTER — Encounter: Payer: BLUE CROSS/BLUE SHIELD | Admitting: Family Medicine

## 2019-09-11 ENCOUNTER — Telehealth: Payer: Self-pay

## 2019-09-11 NOTE — Telephone Encounter (Signed)
Copied from [place] [id number]. Topic: Appointment Scheduling - Prior Auth Required for Appointment >> Sep 11, 2019  7:49 AM Brandon Pitts, Helene Kelp D wrote: No appointment has been scheduled. Patient is requesting CPE appointment. Per scheduling protocol, this appointment requires a prior authorization prior to scheduling. Patient cancel appt due to have covid liked symptoms. Would like a work in for a later time before year ends.  Route to department's PEC pool.

## 2019-11-08 DIAGNOSIS — B349 Viral infection, unspecified: Secondary | ICD-10-CM | POA: Diagnosis not present

## 2019-11-08 DIAGNOSIS — Z1152 Encounter for screening for COVID-19: Secondary | ICD-10-CM | POA: Diagnosis not present

## 2020-01-09 ENCOUNTER — Encounter: Payer: BC Managed Care – PPO | Admitting: Family Medicine

## 2020-01-09 DIAGNOSIS — Z0289 Encounter for other administrative examinations: Secondary | ICD-10-CM

## 2020-04-16 DIAGNOSIS — K2900 Acute gastritis without bleeding: Secondary | ICD-10-CM | POA: Diagnosis not present

## 2020-04-16 DIAGNOSIS — R1010 Upper abdominal pain, unspecified: Secondary | ICD-10-CM | POA: Diagnosis not present

## 2020-04-16 DIAGNOSIS — X501XXA Overexertion from prolonged static or awkward postures, initial encounter: Secondary | ICD-10-CM | POA: Diagnosis not present

## 2020-04-16 DIAGNOSIS — S93402A Sprain of unspecified ligament of left ankle, initial encounter: Secondary | ICD-10-CM | POA: Diagnosis not present

## 2020-04-16 DIAGNOSIS — R197 Diarrhea, unspecified: Secondary | ICD-10-CM | POA: Diagnosis not present

## 2020-04-16 DIAGNOSIS — Z880 Allergy status to penicillin: Secondary | ICD-10-CM | POA: Diagnosis not present

## 2020-04-16 DIAGNOSIS — M25572 Pain in left ankle and joints of left foot: Secondary | ICD-10-CM | POA: Diagnosis not present

## 2020-04-16 DIAGNOSIS — R110 Nausea: Secondary | ICD-10-CM | POA: Diagnosis not present

## 2020-04-16 DIAGNOSIS — S99912A Unspecified injury of left ankle, initial encounter: Secondary | ICD-10-CM | POA: Diagnosis not present

## 2020-07-19 DIAGNOSIS — Z20822 Contact with and (suspected) exposure to covid-19: Secondary | ICD-10-CM | POA: Diagnosis not present

## 2020-08-30 DIAGNOSIS — J069 Acute upper respiratory infection, unspecified: Secondary | ICD-10-CM | POA: Diagnosis not present

## 2020-08-30 DIAGNOSIS — Z03818 Encounter for observation for suspected exposure to other biological agents ruled out: Secondary | ICD-10-CM | POA: Diagnosis not present

## 2020-08-30 DIAGNOSIS — J00 Acute nasopharyngitis [common cold]: Secondary | ICD-10-CM | POA: Diagnosis not present

## 2020-10-25 ENCOUNTER — Other Ambulatory Visit: Payer: Self-pay

## 2023-11-07 DIAGNOSIS — R110 Nausea: Secondary | ICD-10-CM | POA: Diagnosis not present

## 2023-11-07 DIAGNOSIS — J020 Streptococcal pharyngitis: Secondary | ICD-10-CM | POA: Diagnosis not present

## 2023-11-07 DIAGNOSIS — J101 Influenza due to other identified influenza virus with other respiratory manifestations: Secondary | ICD-10-CM | POA: Diagnosis not present

## 2023-11-07 DIAGNOSIS — R051 Acute cough: Secondary | ICD-10-CM | POA: Diagnosis not present

## 2024-02-07 DIAGNOSIS — J029 Acute pharyngitis, unspecified: Secondary | ICD-10-CM | POA: Diagnosis not present

## 2024-02-07 DIAGNOSIS — J00 Acute nasopharyngitis [common cold]: Secondary | ICD-10-CM | POA: Diagnosis not present

## 2024-02-08 DIAGNOSIS — Z03818 Encounter for observation for suspected exposure to other biological agents ruled out: Secondary | ICD-10-CM | POA: Diagnosis not present

## 2024-02-08 DIAGNOSIS — J111 Influenza due to unidentified influenza virus with other respiratory manifestations: Secondary | ICD-10-CM | POA: Diagnosis not present

## 2024-02-08 DIAGNOSIS — R509 Fever, unspecified: Secondary | ICD-10-CM | POA: Diagnosis not present

## 2024-04-07 ENCOUNTER — Emergency Department (HOSPITAL_BASED_OUTPATIENT_CLINIC_OR_DEPARTMENT_OTHER)
Admission: EM | Admit: 2024-04-07 | Discharge: 2024-04-07 | Disposition: A | Discharge status: Home / Self Care | Attending: Emergency Medicine | Admitting: Emergency Medicine

## 2024-04-07 ENCOUNTER — Other Ambulatory Visit: Payer: Self-pay

## 2024-04-07 ENCOUNTER — Emergency Department (HOSPITAL_BASED_OUTPATIENT_CLINIC_OR_DEPARTMENT_OTHER): Admitting: Radiology

## 2024-04-07 DIAGNOSIS — R001 Bradycardia, unspecified: Secondary | ICD-10-CM | POA: Diagnosis not present

## 2024-04-07 DIAGNOSIS — R0789 Other chest pain: Secondary | ICD-10-CM | POA: Diagnosis not present

## 2024-04-07 DIAGNOSIS — R002 Palpitations: Secondary | ICD-10-CM | POA: Diagnosis not present

## 2024-04-07 LAB — BASIC METABOLIC PANEL WITH GFR
Anion gap: 11 (ref 5–15)
BUN: 15 mg/dL (ref 6–20)
CO2: 26 mmol/L (ref 22–32)
Calcium: 10.2 mg/dL (ref 8.9–10.3)
Chloride: 102 mmol/L (ref 98–111)
Creatinine, Ser: 0.99 mg/dL (ref 0.61–1.24)
GFR, Estimated: >60 mL/min (ref >60)
Glucose, Bld: 97 mg/dL (ref 70–99)
Potassium: 4.8 mmol/L (ref 3.5–5.1)
Sodium: 139 mmol/L (ref 135–145)

## 2024-04-07 LAB — CBC
HCT: 46 % (ref 39.0–52.0)
Hemoglobin: 16 g/dL (ref 13.0–17.0)
MCH: 31.4 pg (ref 26.0–34.0)
MCHC: 34.8 g/dL (ref 30.0–36.0)
MCV: 90.2 fL (ref 80.0–100.0)
Platelets: 169 10*3/uL (ref 150–400)
RBC: 5.1 MIL/uL (ref 4.22–5.81)
RDW: 12.7 % (ref 11.5–15.5)
WBC: 5.2 10*3/uL (ref 4.0–10.5)
nRBC: 0 % (ref 0.0–0.2)

## 2024-04-07 LAB — MAGNESIUM: Magnesium: 2.2 mg/dL (ref 1.7–2.4)

## 2024-04-07 LAB — TROPONIN T, HIGH SENSITIVITY: Troponin T High Sensitivity: <15 ng/L (ref <19)

## 2024-04-07 NOTE — Discharge Instructions (Addendum)
 Your workup in the ER did not show any immediate life-threatening causes of chest pain.  You placed a referral to a cardiology outpatient office, however, for further monitoring and workup.  This is due to the fact that you are having chest pain as well as episodes of slower heart rate.  If you do not hear from the scheduling office within 3 business days, call the number above to request a follow-up appointment.

## 2024-04-07 NOTE — ED Provider Notes (Signed)
 [place] EMERGENCY DEPARTMENT AT [facility] Provider Note   CSN: [id number] Arrival date & time: 04/07/24  1018     Patient presents with: Chest Pain   Brandon Pitts is a 28 y.o. male presented to ED with complaint of left-sided chest pain.  Patient reports this began yesterday towards the evening with some palpitations he was having overnight and now having pressure on the left side of his chest back towards his left shoulder blade.  It has been waxing and waning, coming and going.  It is not worse with movement or deep inspiration.  He denies fevers, chills, coughing or congestion.  Denies history of anginal symptoms.  Denies any other medical problems or significant family history of MI at a young age.  He reports he does not use any tobacco products, or recreational drugs.  He does drink 2 cans of caffeinated soda daily.   HPI     Prior to Admission medications   Medication Sig Start Date End Date Taking? Authorizing Provider  oseltamivir  (TAMIFLU ) 75 MG capsule Take 1 capsule (75 mg total) by mouth daily. 10/20/18   Neda Balk, MD    Allergies: Penicillins and Doxycycline     Review of Systems  Updated Vital Signs BP 128/73   Pulse 61   Temp 97.7 F (36.5 C) (Oral)   Resp 11   Ht 6' (1.829 m)   Wt 72.6 kg   SpO2 100%   BMI 21.70 kg/m   Physical Exam Constitutional:      General: He is not in acute distress. HENT:     Head: Normocephalic and atraumatic.   Eyes:     Conjunctiva/sclera: Conjunctivae normal.     Pupils: Pupils are equal, round, and reactive to light.    Cardiovascular:     Rate and Rhythm: Normal rate and regular rhythm.  Pulmonary:     Effort: Pulmonary effort is normal. No respiratory distress.  Abdominal:     General: There is no distension.     Tenderness: There is no abdominal tenderness.   Skin:    General: Skin is warm and dry.   Neurological:     General: No focal deficit present.     Mental Status: He is alert.  Mental status is at baseline.   Psychiatric:        Mood and Affect: Mood normal.        Behavior: Behavior normal.     (all labs ordered are listed, but only abnormal results are displayed) Labs Reviewed  BASIC METABOLIC PANEL WITH GFR  CBC  MAGNESIUM  TROPONIN T, HIGH SENSITIVITY    EKG: EKG Interpretation Date/Time:  Friday April 07 2024 10:23:29 EDT Ventricular Rate:  73 PR Interval:  127 QRS Duration:  118 QT Interval:  380 QTC Calculation: 419 R Axis:   85  Text Interpretation: Sinus rhythm Confirmed by Jerald Molly [phone]) on 04/07/2024 10:29:50 AM  Radiology: Lenell Query Chest 2 View Result Date: 04/07/2024 CLINICAL DATA:  Heart palpitations. Onset last night that have since resolved. EXAM: CHEST - 2 VIEW COMPARISON:  Chest two views 06/21/2014 FINDINGS: Cardiac silhouette and mediastinal contours are within normal limits. The lungs are clear. No pleural effusion or pneumothorax. No acute skeletal abnormality. IMPRESSION: No active cardiopulmonary disease. Electronically Signed   By: Bertina Broccoli M.D.   On: 04/07/2024 13:16     Procedures   Medications Ordered in the ED - No data to display  Medical Decision Making Amount and/or Complexity of Data Reviewed Labs: ordered. Radiology: ordered.   This patient presents to the Emergency Department with complaint of chest pain. This involves an extensive number of treatment options, and is a complaint that carries with it a high risk of complications and morbidity. The differential diagnosis includes ACS vs Pneumothorax vs Reflux/Gastritis vs MSK pain vs Pneumonia vs other.  I felt PE was less likely given that PERC negative  I ordered, reviewed, and interpreted labs.  Pertinent results include no emergent findings.  Initial troponin is undetectable with >6 hour symptom onset, I do not feel repeat troponin testing is emergently indicated per ACEP guidelines I ordered imaging studies which  included dg chest  I independently visualized and interpreted imaging which showed no emergent findings and the monitor tracing which showed 2 episodes of sinus bradycardia HR to mid-40's, otherwise NSR. I agree with the radiologist interpretation I personally reviewed the patients ECG which showed sinus rhythm with no acute ischemic findings  After the interventions stated above, I reevaluated the patient and found that they were symptomatically stable.  The patient's wife subsequently arrived in the emergency department.  She expressed concerns that the patient has had episodes of bradycardia, with heart rate into the 40s, when his typical resting heart rate is around the 60s.  Per my review of telemetry this appears to be a normal sinus arrhythmia, which I explained could be physiological, but they do report some correlation between this bradycardia and the chest discomfort he is feeling.  In this clinical context, I will refer him to cardiology for further workup.  I do not see evidence of high-grade heart block or channel apathy or other life-threatening arrhythmia.  Overall HEART score is < 3 and low risk for MACE.  Based on the patient's clinical exam, vital signs, risk factors, and ED testing, I felt that the patient's overall risk of life-threatening emergency such as ACS, PE, sepsis, or infection was low.    Final diagnoses:  Bradycardia  Chest discomfort    ED Discharge Orders          Ordered    Ambulatory referral to Cardiology        04/07/24 1228               Arvilla Birmingham, MD 04/07/24 [phone]

## 2024-04-07 NOTE — ED Triage Notes (Signed)
 Patient states palpitations last night that have since resolved. Now endorses left sided chest pain that radiates to his back.

## 2024-04-10 NOTE — Progress Notes (Deleted)
  Cardiology Office Note   Date:  04/10/2024  ID:  Song, Brandon Pitts, MRN [id number] PCP: Patient, No Pcp Per  [facility] Providers Cardiologist:  None   History of Present Illness Brandon Pitts is a 28 y.o. male without significant past medical history. He presents today for evaluation of chest pain, bradycardia.   Patient was seen in the ED on 6/20 complaining of left sided chest pain. Reported that he was having palpitations overnight and then developed pressure on the left side ofh is chest. EKG showed sinus rhythm with HR 73 BPM. hsTn negative. K 4.8, creatinine 0.99, WBC 5.2, hemoglobin 16, platelets 169. CXR showed no active cardiopulmonary disease. While in the ED, he had two episodes of bradycardia with HR down to the 40s.   Chest pain   Bradycardia    ROS: ***  Studies Reviewed      *** Risk Assessment/Calculations {Does this patient have ATRIAL FIBRILLATION?[phone]} No BP recorded.  {Refresh Note OR Click here to enter BP  :1}***       Physical Exam VS:  There were no vitals taken for this visit.       Wt Readings from Last 3 Encounters:  04/07/24 160 lb (72.6 kg)  07/04/18 152 lb 3.2 oz (69 kg)  04/05/18 145 lb (65.8 kg)    GEN: Well nourished, well developed in no acute distress NECK: No JVD; No carotid bruits CARDIAC: ***RRR, no murmurs, rubs, gallops RESPIRATORY:  Clear to auscultation without rales, wheezing or rhonchi  ABDOMEN: Soft, non-tender, non-distended EXTREMITIES:  No edema; No deformity   ASSESSMENT AND PLAN ***    {Are you ordering a CV Procedure (e.g. stress test, cath, DCCV, TEE, etc)?   Press F2        :[id number]}  Dispo: ***  Signed, Rollo FABIENE Louder, PA-C

## 2024-04-24 ENCOUNTER — Ambulatory Visit: Attending: Cardiology | Admitting: Cardiology

## 2024-04-24 ENCOUNTER — Ambulatory Visit: Attending: Cardiology

## 2024-04-24 ENCOUNTER — Ambulatory Visit: Admitting: Cardiology

## 2024-04-24 ENCOUNTER — Encounter: Payer: Self-pay | Admitting: Cardiology

## 2024-04-24 VITALS — BP 112/60 | HR 77 | Ht 72.0 in | Wt 167.9 lb

## 2024-04-24 DIAGNOSIS — R079 Chest pain, unspecified: Secondary | ICD-10-CM

## 2024-04-24 DIAGNOSIS — R002 Palpitations: Secondary | ICD-10-CM

## 2024-04-24 DIAGNOSIS — R001 Bradycardia, unspecified: Secondary | ICD-10-CM | POA: Diagnosis not present

## 2024-04-24 NOTE — Patient Instructions (Addendum)
 Medication Instructions:  No medication changes were made during today's visit.  *If you need a refill on your cardiac medications before your next appointment, please call your pharmacy*   Lab Work: Labs will be drawn today...........SABRA  MAGNESIUM, TSH, T3-T4 If you have labs (blood work) drawn today and your tests are completely normal, you will receive your results only by: MyChart Message (if you have MyChart) OR A paper copy in the mail If you have any lab test that is abnormal or we need to change your treatment, we will call you to review the results.   Testing/Procedures: Your physician has requested that you have an echocardiogram. Echocardiography is a painless test that uses sound waves to create images of your heart. It provides your doctor with information about the size and shape of your heart and how well your heart's chambers and valves are working. This procedure takes approximately one hour. There are no restrictions for this procedure. Please do NOT wear cologne, perfume, aftershave, or lotions (deodorant is allowed). Please arrive 15 minutes prior to your appointment time.  Please note: We ask at that you not bring children with you during ultrasound (echo/ vascular) testing. Due to room size and safety concerns, children are not allowed in the ultrasound rooms during exams. Our front office staff cannot provide observation of children in our lobby area while testing is being conducted. An adult accompanying a patient to their appointment will only be allowed in the ultrasound room at the discretion of the ultrasound technician under special circumstances. We apologize for any inconvenience.    Your physician has requested you wear your ZIO patch monitor 14 days.   This is a single patch monitor.  [employer] supplies one patch monitor per enrollment.  Additional stickers are not available.   Please do not apply patch if you will be having a Nuclear Stress Test,  Echocardiogram, Cardiac CT, MRI, or Chest Xray during the time frame you would be wearing the monitor. The patch cannot be worn during these tests.  You cannot remove and re-apply the ZIO XT patch monitor.   Your ZIO patch monitor will be sent USPS Priority mail from [facility] directly to your home address. The monitor may also be mailed to a PO BOX if home delivery is not available.   It may take 3-5 days to receive your monitor after you have been enrolled.   Once you have received you monitor, please review enclosed instructions.  Your monitor has already been registered assigning a specific monitor serial # to you.   Applying the monitor   Shave hair from upper left chest.   Hold abrader disc by orange tab.  Rub abrader in 40 strokes over left upper chest as indicated in your monitor instructions.   Clean area with 4 enclosed alcohol pads .  Use all pads to assure are is cleaned thoroughly.  Let dry.   Apply patch as indicated in monitor instructions.  Patch will be place under collarbone on left side of chest with arrow pointing upward.   Rub patch adhesive wings for 2 minutes.Remove white label marked 1.  Remove white label marked 2.  Rub patch adhesive wings for 2 additional minutes.   While looking in a mirror, press and release button in center of patch.  A small green light will flash 3-4 times .  This will be your only indicator the monitor has been turned on.     Do not shower for the first 24  hours.  You may shower after the first 24 hours.   Press button if you feel a symptom. You will hear a small click.  Record Date, Time and Symptom in the Patient Log Book.   When you are ready to remove patch, follow instructions on last 2 pages of Patient Log Book.  Stick patch monitor onto last page of Patient Log Book.   Place Patient Log Book in [place] box.  Use locking tab on box and tape box closed securely.  The [employer] has [employer] on it.  Please  place in mailbox as soon as possible.  Your physician should have your test results approximately 7 days after the monitor has been mailed back to [facility]   Call [facility] Customer Care at [phone] if you have questions regarding your ZIO XT patch monitor.  Call them immediately if you see an orange light blinking on your monitor.   If your monitor falls off in less than 4 days contact our Monitor department at [phone].  If your monitor becomes loose or falls off after 4 days call [employer] at [phone] for suggestions on securing your monitor.     Follow-Up: At [facility], you and your health needs are our priority.  As part of our continuing mission to provide you with exceptional heart care, we have created designated Provider Care Teams.  These Care Teams include your primary Cardiologist (physician) and Advanced Practice Providers (APPs -  Physician Assistants and Nurse Practitioners) who all work together to provide you with the care you need, when you need it.  We recommend signing up for the patient portal called MyChart.  Sign up information is provided on this After Visit Summary.  MyChart is used to connect with patients for Virtual Visits (Telemedicine).  Patients are able to view lab/test results, encounter notes, upcoming appointments, etc.  Non-urgent messages can be sent to your provider as well.   To learn more about what you can do with MyChart, go to [email or web address].    Your next appointment:   3 month(s)  Provider:   Rollo Louder, PA-C or Madonna Large, MD  will plan to see you again in 3 month(s).    Other Instructions Thank you for choosing [facility]!

## 2024-04-24 NOTE — Progress Notes (Unsigned)
 Enrolled patient for a 14 day Zio XT monitor to be mailed to patients home  [place] to read

## 2024-04-24 NOTE — Progress Notes (Signed)
 Cardiology Office Note   Date:  04/24/2024  ID:  ALIKA SALADIN, DOB 1995-12-11, MRN [id number] PCP: Patient, No Pcp Per  [facility] Providers Cardiologist:  None   History of Present Illness Brandon Pitts is a 28 y.o. male without significant past medical history. He presents today for evaluation of chest pain, bradycardia.   Patient was seen in the ED on 6/20 complaining of left sided chest pain. Reported that he was having palpitations overnight and then developed pressure on the left side of his chest. EKG showed sinus rhythm with HR 73 BPM. hsTn negative. K 4.8, creatinine 0.99, WBC 5.2, hemoglobin 16, platelets 169. CXR showed no active cardiopulmonary disease. While in the ED, he had two episodes of bradycardia with HR down to the 40s.   Patient reports that on 6/19, he was sitting on his couch with his wife when he did feel palpitations.  He estimates that the palpitations lasted about 20 minutes or so.  His wife checked his pulse and it felt irregular.  He was able to go to bed but continued to have episodes of palpitations throughout the night.  He went to work the next morning and developed left-sided chest pressure.  Pressure lasted a few hours and so he went to the ER to be seen.  Since being seen in the ER, he denies recurrence of chest pressure.  No chest pain or dyspnea on exertion.  He has had 1 more episode of palpitations.  He has noticed that his heart rate has been lower than usual.  When at rest, his heart rate will get down into the 40s.  Denies syncope or near syncope.  Has occasional random episodes of dizziness  Patient denies tobacco use, alcohol use or caffeine use.  His dad has a history of atrial fibrillation.  His mom has history of thyroid  issues.   Studies Reviewed   Risk Assessment/Calculations           Physical Exam VS:  BP 112/60   Pulse 77   Ht 6' (1.829 m)   Wt 167 lb 14.4 oz (76.2 kg)   SpO2 99%   BMI 22.77 kg/m        Wt Readings  from Last 3 Encounters:  04/24/24 167 lb 14.4 oz (76.2 kg)  04/07/24 160 lb (72.6 kg)  07/04/18 152 lb 3.2 oz (69 kg)    GEN: Well nourished, well developed in no acute distress. Sitting upright on the exam table in no acute distress NECK: No JVD CARDIAC:  RRR, no murmurs, rubs, gallops. Radial pulses 2+ bilaterally  RESPIRATORY:  Clear to auscultation without rales, wheezing or rhonchi. Normal WOB on room air   ABDOMEN: Soft, non-tender, non-distended EXTREMITIES:  No edema in BLE; No deformity   ASSESSMENT AND PLAN  Palpitations Bradycardia - On 6/19, patient was sitting on the couch when he had a 20-minute episode of palpitations.  His wife checked his pulse and it felt irregular.  He continued to have episodes of palpitations throughout that night and into the next morning.  Was seen in the ER where heart rate was very variable, at times getting as low as the 40s. EKG showed sinus rhythm with heart rate 73 bpm - Patient has only had 1 more episode of palpitations since being seen in the ER.  However, continues to notice episodes of bradycardia with heart rate down to the 40s.  -Denies tobacco use, alcohol use, excessive caffeine use. Not on any HR lowering  medications  - Denies syncope, near syncope - EKG today showed normal sinus rhythm with heart rate 77 bpm - Ordered 2 weeks ZIO - Ordered mag, TSH, free T4.  K 4.8 on 6/20 - Ordered echocardiogram  Chest pain - After patient had palpitations from 6/19-6/20, he had an episode of chest pressure. In the ED 6/20, hsTn negative. EKG was without ischemic changes. CXR showed no active cardiopulmonary disease  - Patient has not had any chest pain since being seen in the ED. No chest pain or DOE  - EKG nonischemic  - Ordered echo as above   Dispo: Follow up in 3 months with Dr. Michele or myself   Signed, Rollo FABIENE Louder, PA-C

## 2024-04-25 ENCOUNTER — Ambulatory Visit: Payer: Self-pay | Admitting: Cardiology

## 2024-04-25 LAB — TSH+T4F+T3FREE
Free T4: 1.02 ng/dL (ref 0.82–1.77)
T3, Free: 3.1 pg/mL (ref 2.0–4.4)
TSH: 2.09 u[IU]/mL (ref 0.450–4.500)

## 2024-04-25 LAB — MAGNESIUM: Magnesium: 1.9 mg/dL (ref 1.6–2.3)

## 2024-05-03 ENCOUNTER — Other Ambulatory Visit: Payer: Self-pay | Admitting: *Deleted

## 2024-05-10 ENCOUNTER — Ambulatory Visit (HOSPITAL_COMMUNITY)
Admission: RE | Admit: 2024-05-10 | Discharge: 2024-05-10 | Disposition: A | Discharge status: Home / Self Care | Source: Ambulatory Visit | Attending: Internal Medicine | Admitting: Internal Medicine

## 2024-05-10 DIAGNOSIS — R002 Palpitations: Secondary | ICD-10-CM | POA: Insufficient documentation

## 2024-05-10 LAB — ECHOCARDIOGRAM COMPLETE
AR max vel: 3.67 cm2
AV Area VTI: 3.49 cm2
AV Area mean vel: 3.4 cm2
AV Mean grad: 4 mmHg
AV Peak grad: 7.5 mmHg
Ao pk vel: 1.37 m/s
Area-P 1/2: 3.19 cm2
S' Lateral: 2.5 cm

## 2024-06-08 DIAGNOSIS — R002 Palpitations: Secondary | ICD-10-CM | POA: Diagnosis not present

## 2024-06-25 DIAGNOSIS — R002 Palpitations: Secondary | ICD-10-CM

## 2024-07-21 NOTE — Progress Notes (Deleted)
  Cardiology Office Note   Date:  07/21/2024  ID:  Brandon Pitts, DOB June 20, 1996, MRN [id number] PCP: Patient, No Pcp Per  [facility] Providers Cardiologist:  None Cardiology APP:  Vicci Rollo SAUNDERS, PA-C    History of Present Illness Brandon Pitts is a 28 y.o. male without significant past medical history. He presents today for follow up   Patient was seen in the ED on 6/20 complaining of left sided chest pain. Reported that he was having palpitations overnight and then developed pressure on the left side of his chest. EKG showed sinus rhythm with HR 73 BPM. hsTn negative. K 4.8, creatinine 0.99, WBC 5.2, hemoglobin 16, platelets 169. CXR showed no active cardiopulmonary disease. While in the ED, he had two episodes of bradycardia with HR down to the 40s.   I saw patient in the clinic on 7/7. At that time, patient denied recurrence of chest pressure/pain. Only had 1 episode of palpitations since being seen in the ED. Underwent echocardiogram 05/10/24 that showed EF 70-75%, no regional wall motion abnormalities, normal LV diastolic parameters, normal RV systolic function, no significant valvular abnormalities. Wore a cardiac monitor that showed predominantly normal sinus rhythm with average HR in th 70s. Slowest HR was 40 BPM. Therre was approx 4% PAC burden.   PACs  Bradycardia - On 6/19, patient was sitting on the couch when he had a 20-minute episode of palpitations.  His wife checked his pulse and it felt irregular.  He continued to have episodes of palpitations throughout that night and into the next morning.  Was seen in the ER where heart rate was very variable, at times getting as low as the 40s.   - Cardiac monitor showed 4% PAC burden. HR ranged from 40-186 BPM with an average HR of 71 BPM.  -Denies tobacco use, alcohol use, excessive caffeine use. Not on any HR lowering medications  - Denies syncope, near syncope - No BB given occasional bradycardia.    Chest pain - After  patient had palpitations from 6/19-6/20, he had an episode of chest pressure. In the ED 6/20, hsTn negative. EKG was without ischemic changes. CXR showed no active cardiopulmonary disease  - Patient has not had any chest pain since being seen in the ED. No chest pain or DOE  - EKG nonischemic  - Echo 7/23 with EF 70-75%, no regional wall motion abnormalities, normal RV systolic function   ROS: ***  Studies Reviewed      *** Risk Assessment/Calculations {Does this patient have ATRIAL FIBRILLATION?:[phone]} No BP recorded.  {Refresh Note OR Click here to enter BP  :1}***       Physical Exam VS:  There were no vitals taken for this visit.       Wt Readings from Last 3 Encounters:  04/24/24 167 lb 14.4 oz (76.2 kg)  04/07/24 160 lb (72.6 kg)  07/04/18 152 lb 3.2 oz (69 kg)    GEN: Well nourished, well developed in no acute distress NECK: No JVD; No carotid bruits CARDIAC: ***RRR, no murmurs, rubs, gallops RESPIRATORY:  Clear to auscultation without rales, wheezing or rhonchi  ABDOMEN: Soft, non-tender, non-distended EXTREMITIES:  No edema; No deformity   ASSESSMENT AND PLAN ***    {Are you ordering a CV Procedure (e.g. stress test, cath, DCCV, TEE, etc)?   Press F2        :[id number]}  Dispo: ***  Signed, Rollo FABIENE Vicci, PA-C

## 2024-08-02 ENCOUNTER — Ambulatory Visit: Attending: Cardiovascular Disease | Admitting: Cardiology

## 2024-08-03 ENCOUNTER — Encounter: Payer: Self-pay | Admitting: Cardiology
